# Patient Record
Sex: Female | Born: 1985 | Race: Black or African American | Hispanic: No | Smoking: Never smoker
Health system: Southern US, Community
[De-identification: ages and names within clinical notes are randomized; demographics above are authoritative.]

## PROBLEM LIST (undated history)

## (undated) ENCOUNTER — Inpatient Hospital Stay (HOSPITAL_COMMUNITY): Payer: Self-pay

## (undated) DIAGNOSIS — O24419 Gestational diabetes mellitus in pregnancy, unspecified control: Secondary | ICD-10-CM

## (undated) DIAGNOSIS — O1490 Unspecified pre-eclampsia, unspecified trimester: Secondary | ICD-10-CM

## (undated) DIAGNOSIS — Z8759 Personal history of other complications of pregnancy, childbirth and the puerperium: Secondary | ICD-10-CM

## (undated) DIAGNOSIS — I10 Essential (primary) hypertension: Secondary | ICD-10-CM

## (undated) DIAGNOSIS — K219 Gastro-esophageal reflux disease without esophagitis: Secondary | ICD-10-CM

## (undated) DIAGNOSIS — Z8632 Personal history of gestational diabetes: Secondary | ICD-10-CM

## (undated) DIAGNOSIS — Z87440 Personal history of urinary (tract) infections: Secondary | ICD-10-CM

## (undated) DIAGNOSIS — Z862 Personal history of diseases of the blood and blood-forming organs and certain disorders involving the immune mechanism: Secondary | ICD-10-CM

## (undated) DIAGNOSIS — Z98891 History of uterine scar from previous surgery: Secondary | ICD-10-CM

## (undated) HISTORY — DX: Unspecified pre-eclampsia, unspecified trimester: O14.90

## (undated) HISTORY — DX: Personal history of other complications of pregnancy, childbirth and the puerperium: Z87.59

## (undated) HISTORY — DX: History of uterine scar from previous surgery: Z98.891

## (undated) HISTORY — DX: Personal history of gestational diabetes: Z86.32

## (undated) HISTORY — DX: Personal history of diseases of the blood and blood-forming organs and certain disorders involving the immune mechanism: Z86.2

## (undated) HISTORY — DX: Personal history of urinary (tract) infections: Z87.440

## (undated) HISTORY — DX: Gastro-esophageal reflux disease without esophagitis: K21.9

## (undated) HISTORY — DX: Gestational diabetes mellitus in pregnancy, unspecified control: O24.419

---

## 2010-06-02 ENCOUNTER — Emergency Department: Payer: Self-pay | Admitting: Emergency Medicine

## 2011-11-19 ENCOUNTER — Ambulatory Visit: Payer: Self-pay | Admitting: Family Medicine

## 2012-01-20 ENCOUNTER — Emergency Department: Payer: Self-pay | Admitting: Emergency Medicine

## 2012-01-20 LAB — URINALYSIS, COMPLETE
Bilirubin,UR: NEGATIVE
Glucose,UR: NEGATIVE mg/dL (ref 0–75)
Ketone: NEGATIVE
Ph: 6 (ref 4.5–8.0)
Protein: NEGATIVE
RBC,UR: 1 /HPF (ref 0–5)
Specific Gravity: 1.014 (ref 1.003–1.030)
Squamous Epithelial: 2
WBC UR: 1 /HPF (ref 0–5)

## 2012-01-20 LAB — CBC
HCT: 34.5 % — ABNORMAL LOW (ref 35.0–47.0)
MCH: 28.5 pg (ref 26.0–34.0)
MCHC: 32.6 g/dL (ref 32.0–36.0)
MCV: 87 fL (ref 80–100)
RDW: 14.5 % (ref 11.5–14.5)
WBC: 7.8 10*3/uL (ref 3.6–11.0)

## 2012-02-03 ENCOUNTER — Ambulatory Visit: Payer: Self-pay | Admitting: Family Medicine

## 2012-06-24 ENCOUNTER — Inpatient Hospital Stay: Payer: Self-pay | Admitting: Obstetrics and Gynecology

## 2012-06-24 DIAGNOSIS — O1493 Unspecified pre-eclampsia, third trimester: Secondary | ICD-10-CM

## 2012-06-24 LAB — PIH PROFILE
Anion Gap: 9 (ref 7–16)
BUN: 6 mg/dL — ABNORMAL LOW (ref 7–18)
Chloride: 106 mmol/L (ref 98–107)
Creatinine: 0.44 mg/dL — ABNORMAL LOW (ref 0.60–1.30)
EGFR (African American): >60
EGFR (Non-African Amer.): >60
Glucose: 90 mg/dL (ref 65–99)
HGB: 12.5 g/dL (ref 12.0–16.0)
MCH: 29 pg (ref 26.0–34.0)
Osmolality: 273 (ref 275–301)
Platelet: 237 10*3/uL (ref 150–440)
Potassium: 3.6 mmol/L (ref 3.5–5.1)
RBC: 4.31 10*6/uL (ref 3.80–5.20)
SGOT(AST): 23 U/L (ref 15–37)
Uric Acid: 5.4 mg/dL (ref 2.6–6.0)
WBC: 7.9 10*3/uL (ref 3.6–11.0)

## 2012-06-24 LAB — DIFFERENTIAL
Basophil #: 0.1 10*3/uL (ref 0.0–0.1)
Basophil %: 0.7 %
Eosinophil #: 0.1 10*3/uL (ref 0.0–0.7)
Eosinophil %: 0.8 %
Lymphocyte %: 27.7 %
Monocyte %: 8.4 %
Neutrophil %: 62.4 %

## 2012-06-24 LAB — PROTEIN / CREATININE RATIO, URINE
Creatinine, Urine: 33.4 mg/dL (ref 30.0–125.0)
Protein, Random Urine: 22 mg/dL — ABNORMAL HIGH (ref 0–12)
Protein/Creat. Ratio: 659 mg/gCREAT — ABNORMAL HIGH (ref 0–200)

## 2012-06-25 LAB — HEMOGLOBIN: HGB: 10.6 g/dL — ABNORMAL LOW (ref 12.0–16.0)

## 2012-10-15 ENCOUNTER — Emergency Department: Payer: Self-pay | Admitting: Emergency Medicine

## 2014-08-16 ENCOUNTER — Observation Stay: Payer: Self-pay | Admitting: Obstetrics and Gynecology

## 2014-08-16 LAB — URINALYSIS, COMPLETE
Bilirubin,UR: NEGATIVE
Blood: NEGATIVE
Glucose,UR: NEGATIVE mg/dL (ref 0–75)
Ketone: NEGATIVE
NITRITE: NEGATIVE
PROTEIN: NEGATIVE
Ph: 8 (ref 4.5–8.0)
RBC,UR: 2 /HPF (ref 0–5)
SPECIFIC GRAVITY: 1.01 (ref 1.003–1.030)

## 2014-08-16 LAB — FETAL FIBRONECTIN
Appearance: NORMAL
Fetal Fibronectin: NEGATIVE

## 2014-10-02 ENCOUNTER — Inpatient Hospital Stay: Payer: Self-pay | Admitting: Obstetrics and Gynecology

## 2014-10-02 DIAGNOSIS — O244 Gestational diabetes mellitus: Secondary | ICD-10-CM

## 2014-10-02 LAB — CBC WITH DIFFERENTIAL/PLATELET
Basophil #: 0 10*3/uL (ref 0.0–0.1)
Basophil %: 0.3 %
EOS ABS: 0 10*3/uL (ref 0.0–0.7)
Eosinophil %: 0.5 %
HCT: 38.4 % (ref 35.0–47.0)
HGB: 12.2 g/dL (ref 12.0–16.0)
LYMPHS ABS: 2 10*3/uL (ref 1.0–3.6)
Lymphocyte %: 26.2 %
MCH: 27.2 pg (ref 26.0–34.0)
MCHC: 31.7 g/dL — ABNORMAL LOW (ref 32.0–36.0)
MCV: 86 fL (ref 80–100)
Monocyte #: 0.5 x10 3/mm (ref 0.2–0.9)
Monocyte %: 6.8 %
NEUTROS PCT: 66.2 %
Neutrophil #: 5 10*3/uL (ref 1.4–6.5)
Platelet: 252 10*3/uL (ref 150–440)
RBC: 4.47 10*6/uL (ref 3.80–5.20)
RDW: 14.3 % (ref 11.5–14.5)
WBC: 7.5 10*3/uL (ref 3.6–11.0)

## 2014-10-03 LAB — CBC WITH DIFFERENTIAL/PLATELET
BASOS PCT: 0.6 %
Basophil #: 0.1 10*3/uL (ref 0.0–0.1)
Eosinophil #: 0 10*3/uL (ref 0.0–0.7)
Eosinophil %: 0.3 %
HCT: 32.7 % — ABNORMAL LOW (ref 35.0–47.0)
HGB: 10.5 g/dL — ABNORMAL LOW (ref 12.0–16.0)
Lymphocyte #: 1.4 10*3/uL (ref 1.0–3.6)
Lymphocyte %: 9.9 %
MCH: 27.5 pg (ref 26.0–34.0)
MCHC: 32.2 g/dL (ref 32.0–36.0)
MCV: 85 fL (ref 80–100)
MONOS PCT: 6.2 %
Monocyte #: 0.9 x10 3/mm (ref 0.2–0.9)
NEUTROS PCT: 83 %
Neutrophil #: 11.4 10*3/uL — ABNORMAL HIGH (ref 1.4–6.5)
PLATELETS: 195 10*3/uL (ref 150–440)
RBC: 3.82 10*6/uL (ref 3.80–5.20)
RDW: 14.9 % — ABNORMAL HIGH (ref 11.5–14.5)
WBC: 13.7 10*3/uL — ABNORMAL HIGH (ref 3.6–11.0)

## 2014-10-03 LAB — GLUCOSE, RANDOM: GLUCOSE: 95 mg/dL (ref 65–99)

## 2014-10-27 ENCOUNTER — Emergency Department: Payer: Self-pay | Admitting: Emergency Medicine

## 2015-02-16 NOTE — Op Note (Signed)
PATIENT NAME:  Jordan Mathis, Jordan Mathis MR#:  [id number] DATE OF BIRTH:  1986-05-22  DATE OF PROCEDURE:  10/02/2014  PREOPERATIVE DIAGNOSES:  1.  Intrauterine pregnancy at 39 weeks and 4 days.  2.  Gestational diabetes type A1.  3.  Ultrasound indicated for EFW of 4500gm 4.  Maternal desire for primary cesarean section.   POST OPERATIVE DIAGNOSES: 1.  Intrauterine pregnancy at 39 weeks and 4 days.  2.  Gestational diabetes type A1.  3.  Ultrasound indicated for EFW of 4500gm 4.  Maternal desire for primary cesarean section.   PROCEDURE: A primary low transverse cesarean section via Pfannenstiel skin incision with double layer uterine closure.   SURGEON: [place] Bingharlie Chayna Surratt, MD   ASSISTANT: Marta Lamasamara K. Brothers, CNM    INTRAVENOUS FLUIDS: One liter crystalloid.   ESTIMATED BLOOD LOSS: 700 mL.   URINE OUTPUT: 55 mL.   VENOUS THROMBOEMBOLISM PROPHYLAXIS: SCDs applied to bilateral lower extremities.   ANTIBIOTICS: Ancef 2 grams given preoperatively.   COMPLICATIONS: None.   FINDINGS: Grossly normal uterus, tubes, and ovaries bilaterally. Female infant in cephalic presentation, clear amniotic fluid. Birth weight of 3700 grams. Apgars of 9 and 9. Grossly normal placenta.   DESCRIPTION OF PROCEDURE: The patient was taken to the operating room where anesthesia was administered. She was then prepped and draped in normal sterile fashion in the dorsal supine position with a leftward tilt. A Pfannenstiel skin incision was made with the scalpel and carried down to the underlying fascia with the Bovie. The fascia was then nicked in the midline, and using Kochers superiorly and inferiorly the rectus muscles were dissected off the fascia. Next, the rectus muscles were then separated in the midline bluntly and the bladder blade was inserted. Next, a bladder flap was created with the Metzenbaum scissors and the bladder blade was then reinserted. Next, the hysterotomy was made with a scalpel and the amniotic sac  was then punctured with the Allis clamp. The infant was then delivered atraumatically without issue and cord clamped x 2 and cut and handed to the waiting pediatricians. Next, the placenta was then gradually expressed and the uterus was then exteriorized, cleared of all clots and debris. Next, the hysterotomy was then closed with a running locking suture of 1-0 Monocryl and a second imbricating layer was then placed for excellent hemostasis. The uterus was then returned to the abdomen. The peritoneum was then closed with 3-0 Vicryl in a running fashion and the fascia was then closed bilaterally with 1-0 Vicryl. The subcutaneous layer was then closed with interrupted sutures of 2-0 plain gut, and the skin was then closed with 4-0 Monocryl in a subcuticular fashion. Sponge, lap, needle, and instrument counts were correct x 2. The patient was taken to the recovery room awake, alert, breathing independently in a stable condition.    ____________________________ [place] Bingharlie Arayla Kruschke, MD cp:bm Mathis: 10/02/2014 20:41:00 ET T: 10/03/2014 05:42:59 ET JOB#: [id number]  cc: [place] Bingharlie Emilie Carp, MD, <Dictator> [place] BingHARLIE Houda Brau MD ELECTRONICALLY SIGNED 10/07/2014 14:26

## 2015-02-25 NOTE — H&P (Signed)
L&D Evaluation:  History:  HPI 29 year old G2P1001 at 32 weeks 6 days [facility] presenting with contractions q6230minutes since 0800 today.  States she is feeling sharp pain in lower abdomen groin, exacerbated by movement.  No dysuria.  No other GI symptoms.  She has no prior history of preterm labor.  +FM, no LOF, no VB.  She denies intercourse in the last 24-hrs.  The patients prior pregnancy was complicated by preeclampsia.  Her current pregnancy is noteable for recently diagnosed GDM.  Her 1-hr 172 with follow up 3-hr 109, 204, 180, 88 (3/4 abnormal) on 08/02/14.  She has not yet started checking her BG.   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  HEENT, CNS, GI, GU, Respiratory, CV, Renal and Musculoskeletal systems were found to be normal.   Exam:  Vital Signs stable  121/66   Urine Protein not completed, UA pending   General no apparent distress   Mental Status clear   Chest no increased work of breathing   Abdomen gravid, non-tender   Estimated Fetal Weight Average for gestational age   Back no CVAT   Edema no edema   Pelvic no external lesions, FTP/Long/high   Mebranes Intact   FHT normal rate with no decels, 150, moderate, +accles, no decels   Ucx some mild irritability   Skin dry, no lesions   Impression:  Impression 29 yo G2P1001 at 702w6d r/o PTL   Plan:  Plan EFM/NST, monitor contractions and for cervical change   Comments 1) R/O PTL - cervix only fingertip, no contractions on tocometer maybe some mild uterine irritability - UA - FFN - Recheck 1-2hrs  2) Fetus - categoy I tracing  3) B pos / ABSC neg / RI / VZI / RPR NR / HBsAg neg / HIV neg  4) TDAP and influenza vaccination received 07/31/2014  5) GDM - provided Rx for  glucometer, testing strips, and lancets  6) Disposition - pending rechcek   Follow Up Appointment already scheduled. 08/21/2014 [id number]   Electronic Signatures for Addendum Section:  Lorrene ReidStaebler, Hulet Ehrmann M (MD) (Signed Addendum 30-Oct-15 22:14)  Cervix stable on recheck, no contractions on toco, FFN negative.  UA possible UTI Rx macrobid 100mg  po bid x 7 days.  Routine precautions.   Electronic Signatures: Lorrene ReidStaebler, Devorah Givhan M (MD)  (Signed 30-Oct-15 21:02)  Authored: L&D Evaluation   Last Updated: 30-Oct-15 22:14 by Lorrene ReidStaebler, Keaton Beichner M (MD)

## 2015-04-02 ENCOUNTER — Emergency Department
Admission: EM | Admit: 2015-04-02 | Discharge: 2015-04-02 | Disposition: A | Discharge status: Home / Self Care | Payer: Commercial Managed Care - PPO | Attending: Student | Admitting: Student

## 2015-04-02 DIAGNOSIS — L02411 Cutaneous abscess of right axilla: Secondary | ICD-10-CM

## 2015-04-02 DIAGNOSIS — Z9889 Other specified postprocedural states: Secondary | ICD-10-CM

## 2015-04-02 MED ORDER — LIDOCAINE-EPINEPHRINE (PF) 1 %-1:200000 IJ SOLN
INTRAMUSCULAR | Status: AC
  Filled 2015-04-02: qty 30

## 2015-04-02 MED ORDER — KETOROLAC TROMETHAMINE 10 MG PO TABS: 10.0000 mg | ORAL_TABLET | Freq: Three times a day (TID) | ORAL | Status: DC

## 2015-04-02 MED ORDER — SULFAMETHOXAZOLE-TRIMETHOPRIM 800-160 MG PO TABS: 2.0000 | ORAL_TABLET | Freq: Two times a day (BID) | ORAL | Status: DC

## 2015-04-02 NOTE — ED Notes (Signed)
Patient arrives to [facility] ED with complaint of right axillary abscess. Denies n/v fever. Denies drainage

## 2015-04-02 NOTE — ED Provider Notes (Signed)
[facility] Emergency Department Provider Note ____________________________________________  Time seen: 0828  I have reviewed the triage vital signs and the nursing notes.  HISTORY  Chief Complaint Abscess  HPI Jordan Mathis is a 29 y.o. female who reports to the ED with 2 weeks complaint of tender, swollen area to the right armpit. She has applied warm compresses and dosed Tylenol for symptom relief. She denies fever, chills, sweats, or spontaneous drainage.   No past medical history on file.  There are no active problems to display for this patient.  No past surgical history on file.  Current Outpatient Rx  Name  Route  Sig  Dispense  Refill  . ketorolac (TORADOL) 10 MG tablet   Oral   Take 1 tablet (10 mg total) by mouth every 8 (eight) hours.   15 tablet   0   . sulfamethoxazole-trimethoprim (BACTRIM DS,SEPTRA DS) 800-160 MG per tablet   Oral   Take 2 tablets by mouth 2 (two) times daily.   40 tablet   0    Allergies Review of patient's allergies indicates not on file.  No family history on file.  Social History History  Substance Use Topics  . Smoking status: Not on file  . Smokeless tobacco: Not on file  . Alcohol Use: Not on file   Review of Systems  Constitutional: Negative for fever. Eyes: Negative for visual changes. ENT: Negative for sore throat. Cardiovascular: Negative for chest pain. Respiratory: Negative for shortness of breath. Gastrointestinal: Negative for abdominal pain, vomiting and diarrhea. Genitourinary: Negative for dysuria. Musculoskeletal: Negative for back pain. Skin: Negative for rash. Positive for abscess formation as above. Neurological: Negative for headaches, focal weakness or numbness. ____________________________________________  PHYSICAL EXAM:  VITAL SIGNS: ED Triage Vitals  Enc Vitals Group     BP 04/02/15 0722 132/91 mmHg     Pulse Rate 04/02/15 0722 99     Resp 04/02/15 0722 18      Temp 04/02/15 0722 97.8 F (36.6 C)     Temp Source 04/02/15 0722 Oral     SpO2 04/02/15 0722 99 %     Weight 04/02/15 0722 143 lb (64.864 kg)     Height 04/02/15 0722 5\' 3"  (1.6 m)     Head Cir --      Peak Flow --      Pain Score 04/02/15 0741 8     Pain Loc --      Pain Edu? --      Excl. in GC? --    Constitutional: Alert and oriented. Well appearing and in no distress. Eyes: Conjunctivae are normal. PERRL. Normal extraocular movements. ENT   Head: Normocephalic and atraumatic.   Nose: No congestion/rhinnorhea.   Mouth/Throat: Mucous membranes are moist.   Neck: No stridor. Hematological/Lymphatic/Immunilogical: No cervical lymphadenopathy. Cardiovascular: Normal rate, regular rhythm.  Respiratory: Normal respiratory effort. Musculoskeletal: Nontender with normal range of motion in all extremities. Neurologic:  Normal speech and language. No gross focal neurologic deficits are appreciated. Skin:  Skin is warm, dry and intact. No rash noted. Tender, firm, erythematous, 4 cm lesion with central fluctuance, to the right axilla. Psychiatric: Mood and affect are normal. Patient exhibits appropriate insight and judgment. ____________________________________________  INCISION AND DRAINAGE Performed by: Lissa Hoard Consent: Verbal consent obtained. Risks and benefits: risks, benefits and alternatives were discussed Type: abscess  Body area: Right axilla  Anesthesia: local infiltration  Incision was made with a #11 scalpel.  Local anesthetic: lidocaine 1%  w/ epinephrine  Anesthetic total: 5 ml  Complexity: complex Blunt dissection to break up loculations  Drainage: purulent  Drainage amount:  5 ml  Packing material: 1/4 in iodoform gauze  Patient tolerance: Patient tolerated the procedure well with no immediate complications. ____________________________________________  INITIAL IMPRESSION / ASSESSMENT AND PLAN / ED COURSE  Right  axilla abscess and cellulitis s/p I&D. ____________________________________________  FINAL CLINICAL IMPRESSION(S) / ED DIAGNOSES  Final diagnoses:  Abscess of axilla, right  Status post incision and drainage     Lissa Hoard, PA-C 04/02/15 [id number]  Gayla Doss, MD 04/08/15 [phone]

## 2015-04-02 NOTE — Discharge Instructions (Signed)
Abscess Care After An abscess (also called a boil or furuncle) is an infected area that contains a collection of pus. Signs and symptoms of an abscess include pain, tenderness, redness, or hardness, or you may feel a moveable soft area under your skin. An abscess can occur anywhere in the body. The infection may spread to surrounding tissues causing cellulitis. A cut (incision) by the surgeon was made over your abscess and the pus was drained out. Gauze may have been packed into the space to provide a drain that will allow the cavity to heal from the inside outwards. The boil may be painful for 5 to 7 days. Most people with a boil do not have high fevers. Your abscess, if seen early, may not have localized, and may not have been lanced. If not, another appointment may be required for this if it does not get better on its own or with medications. HOME CARE INSTRUCTIONS   Only take over-the-counter or prescription medicines for pain, discomfort, or fever as directed by your caregiver.  When you bathe, soak and then remove gauze or iodoform packs at least daily or as directed by your caregiver. You may then wash the wound gently with mild soapy water. Repack with gauze or do as your caregiver directs. SEEK IMMEDIATE MEDICAL CARE IF:   You develop increased pain, swelling, redness, drainage, or bleeding in the wound site.  You develop signs of generalized infection including muscle aches, chills, fever, or a general ill feeling.  An oral temperature above 102 F (38.9 C) develops, not controlled by medication. See your caregiver for a recheck if you develop any of the symptoms described above. If medications (antibiotics) were prescribed, take them as directed. Document Released: 04/22/2005 Document Revised: 12/27/2011 Document Reviewed: 12/18/2007 [facility] Patient Information 2015 [place], [place]. This information is not intended to replace advice given to you by your health care provider. Make sure  you discuss any questions you have with your health care provider.  Take the prescription meds as directed.  Apply warm compresses to promote healing. Return to the ED in 2 days for wound recheck.

## 2015-04-04 ENCOUNTER — Emergency Department
Admission: EM | Admit: 2015-04-04 | Discharge: 2015-04-04 | Discharge status: Against Medical Advice | Payer: Commercial Managed Care - PPO | Attending: Emergency Medicine | Admitting: Emergency Medicine

## 2015-04-04 DIAGNOSIS — Z4801 Encounter for change or removal of surgical wound dressing: Secondary | ICD-10-CM | POA: Diagnosis present

## 2015-04-04 NOTE — ED Notes (Signed)
Patient to ED for wound re-check to drained abscess of RUE.

## 2016-07-08 ENCOUNTER — Encounter: Payer: Self-pay | Admitting: Emergency Medicine

## 2016-07-08 ENCOUNTER — Emergency Department
Admission: EM | Admit: 2016-07-08 | Discharge: 2016-07-08 | Disposition: A | Discharge status: Home / Self Care | Payer: Commercial Managed Care - PPO | Attending: Student in an Organized Health Care Education/Training Program | Admitting: Student in an Organized Health Care Education/Training Program

## 2016-07-08 DIAGNOSIS — G44219 Episodic tension-type headache, not intractable: Secondary | ICD-10-CM

## 2016-07-08 MED ORDER — BUTALBITAL-APAP-CAFFEINE 50-325-40 MG PO TABS: 1.0000 | ORAL_TABLET | Freq: Four times a day (QID) | ORAL | 0 refills | Status: DC | PRN

## 2016-07-08 MED ORDER — METHOCARBAMOL 500 MG PO TABS: 500.0000 mg | ORAL_TABLET | Freq: Four times a day (QID) | ORAL | 0 refills | Status: DC | PRN

## 2016-07-08 NOTE — ED Triage Notes (Signed)
Pt states she has had a headache x5 days, for which her PCP prescribed Motrin 800mg , which has not helped the pain.  Pt states when she stands up from sitting, she feels "swimmy-headed" and "off".  Pt reports congestion in ears and head.  Denies fever at home.  Pt took Ibuprofen this morning at 0600.

## 2016-07-08 NOTE — ED Provider Notes (Signed)
[facility] Emergency Department Provider Note  ____________________________________________  Time seen: Approximately 9:48 AM  I have reviewed the triage vital signs and the nursing notes.   HISTORY  Chief Complaint Headache    HPI Jordan Mathis is a 30 y.o. female presents with a 5 day history of a headache. He saw her PCP was given Motrin 800 with no relief. Patient states that she occasionally feels a little bit dizzy when she stands up and noticed her blood pressures upper lobe it. Patient denies it being the worst headache of her life. Refuses to have a head CT at this time. States that she is under stress from recently moving and transferring children back and forth to [facility].   History reviewed. No pertinent past medical history.  There are no active problems to display for this patient.   Past Surgical History:  Procedure Laterality Date  . CESAREAN SECTION      Prior to Admission medications   Medication Sig Start Date End Date Taking? Authorizing Provider  butalbital-acetaminophen-caffeine (FIORICET, ESGIC) [phone] MG tablet Take 1-2 tablets by mouth every 6 (six) hours as needed for headache. 07/08/16   Evangeline Dakin, PA-C  methocarbamol (ROBAXIN) 500 MG tablet Take 1 tablet (500 mg total) by mouth every 6 (six) hours as needed for muscle spasms. 07/08/16   Evangeline Dakin, PA-C    Allergies Review of patient's allergies indicates no known allergies.  No family history on file.  Social History Social History  Substance Use Topics  . Smoking status: Never Smoker  . Smokeless tobacco: Never Used  . Alcohol use Yes     Comment: occasionally    Review of Systems Constitutional: No fever/chills Eyes: No visual changes. ENT: No sore throat. Cardiovascular: Denies chest pain. Respiratory: Denies shortness of breath. Gastrointestinal: No abdominal pain.  No nausea, no vomiting.  No diarrhea.  No  constipation. Genitourinary: Negative for dysuria. Musculoskeletal: Negative for back pain. Skin: Negative for rash. Neurological: Positive for headaches, negative for focal weakness or numbness.  10-point ROS otherwise negative.  ____________________________________________   PHYSICAL EXAM:  VITAL SIGNS: ED Triage Vitals  Enc Vitals Group     BP 07/08/16 0859 (!) 149/101     Pulse Rate 07/08/16 0859 81     Resp 07/08/16 0859 (!) 2     Temp 07/08/16 0859 99 F (37.2 C)     Temp Source 07/08/16 0859 Oral     SpO2 07/08/16 0859 100 %     Weight 07/08/16 0900 165 lb (74.8 kg)     Height 07/08/16 0900 5\' 3"  (1.6 m)     Head Circumference --      Peak Flow --      Pain Score 07/08/16 0901 7     Pain Loc --      Pain Edu? --      Excl. in GC? --     Constitutional: Alert and oriented. Well appearing and in no acute distress. Eyes: Conjunctivae are normal. PERRL. EOMI.Fundi unremarkable Head: Atraumatic. Nose: No congestion/rhinnorhea. Mouth/Throat: Mucous membranes are moist.  Oropharynx non-erythematous. Neck: No stridor.  Supple, full range of motion. Slight tenderness noted at the base of the neck. Cardiovascular: Normal rate, regular rhythm. Grossly normal heart sounds.  Good peripheral circulation. Respiratory: Normal respiratory effort.  No retractions. Lungs CTAB. Musculoskeletal: No lower extremity tenderness nor edema.  No joint effusions. Neurologic:  Normal speech and language. No gross focal neurologic deficits are appreciated. No gait  instability. Skin:  Skin is warm, dry and intact. No rash noted. Psychiatric: Mood and affect are normal. Speech and behavior are normal.  ____________________________________________   LABS (all labs ordered are listed, but only abnormal results are displayed)  Labs Reviewed - No data to display ____________________________________________  EKG   ____________________________________________  RADIOLOGY  Head CT  deferred at this visit. ____________________________________________   PROCEDURES  Procedure(s) performed: None  Critical Care performed: No  ____________________________________________   INITIAL IMPRESSION / ASSESSMENT AND PLAN / ED COURSE  Pertinent labs & imaging results that were available during my care of the patient were reviewed by me and considered in my medical decision making (see chart for details). Review of the [place] CSRS was performed in accordance of the [facility] prior to dispensing any controlled drugs.  Acute headache. More than likely muscle contraction/tension headache. Rx given for Fioricet and Robaxin. She is to follow-up with her PCP or return to ER as needed. Encouraged her to follow up and monitor her blood pressure as well.  Clinical Course    ____________________________________________   FINAL CLINICAL IMPRESSION(S) / ED DIAGNOSES  Final diagnoses:  Episodic tension-type headache, not intractable     This chart was dictated using voice recognition software/Dragon. Despite best efforts to proofread, errors can occur which can change the meaning. Any change was purely unintentional.   Evangeline Dakinharles M Ericha Whittingham, PA-C 07/08/16 1013    Willy EddyPatrick Robinson, MD 07/08/16 1056

## 2016-07-08 NOTE — Discharge Instructions (Signed)
Follow-up with your local health care provider to monitor your blood pressure.

## 2016-08-21 ENCOUNTER — Emergency Department (HOSPITAL_COMMUNITY)
Admission: EM | Admit: 2016-08-21 | Discharge: 2016-08-21 | Disposition: A | Discharge status: Home / Self Care | Payer: Commercial Managed Care - PPO | Attending: Emergency Medicine | Admitting: Emergency Medicine

## 2016-08-21 ENCOUNTER — Encounter (HOSPITAL_COMMUNITY): Payer: Self-pay

## 2016-08-21 DIAGNOSIS — J029 Acute pharyngitis, unspecified: Secondary | ICD-10-CM

## 2016-08-21 LAB — RAPID STREP SCREEN (MED CTR MEBANE ONLY): STREPTOCOCCUS, GROUP A SCREEN (DIRECT): NEGATIVE

## 2016-08-21 MED ORDER — CETIRIZINE HCL 10 MG PO TABS: 10.0000 mg | ORAL_TABLET | Freq: Every day | ORAL | 0 refills | Status: DC

## 2016-08-21 MED ORDER — IBUPROFEN 800 MG PO TABS: 800.0000 mg | ORAL_TABLET | Freq: Three times a day (TID) | ORAL | 0 refills | Status: DC

## 2016-08-21 MED ORDER — ACETAMINOPHEN 500 MG PO TABS: 1000.0000 mg | ORAL_TABLET | Freq: Four times a day (QID) | ORAL | 0 refills | Status: DC | PRN

## 2016-08-21 NOTE — ED Notes (Signed)
Declined W/C at D/C and was escorted to lobby by RN. 

## 2016-08-21 NOTE — ED Provider Notes (Signed)
[facility]-EMERG[facility]Y DEPT Provider Note   By signing my name below, I, Earmon PhoenixJennifer Waddell, attest that this documentation has been prepared under the direction and in the presence of Cheri FowlerKayla Jailyn Langhorst, PA-C. Electronically Signed: Earmon PhoenixJennifer Waddell, ED Scribe. 08/21/16. 9:52 AM.   History   Chief Complaint Chief Complaint  Patient presents with  . Sore Throat    HPI  HPI Comments:  Stellarose Dominque Laural BenesJohnson is a 30 y.o. female who presents to the Emergency Department complaining of sore throat that began yesterday. She reports associated sneezing, chills, generalized body aches and left ear pain. She has taken Tylenol with no significant relief of the pain. She denies modifying factors. She denies fever, cough, rhinorrhea, nasal congestion, difficulty breathing or swallowing.    History reviewed. No pertinent past medical history.  There are no active problems to display for this patient.   Past Surgical History:  Procedure Laterality Date  . CESAREAN SECTION      OB History    No data available       Home Medications    Prior to Admission medications   Medication Sig Start Date End Date Taking? Authorizing Provider  acetaminophen (TYLENOL) 500 MG tablet Take 2 tablets (1,000 mg total) by mouth every 6 (six) hours as needed. 08/21/16   Cheri FowlerKayla Adie Vilar, PA-C  butalbital-acetaminophen-caffeine (FIORICET, ESGIC) [phone] MG tablet Take 1-2 tablets by mouth every 6 (six) hours as needed for headache. 07/08/16   Evangeline Dakinharles M Beers, PA-C  cetirizine (ZYRTEC) 10 MG tablet Take 1 tablet (10 mg total) by mouth daily. 08/21/16   Cheri FowlerKayla Yaira Bernardi, PA-C  ibuprofen (ADVIL,MOTRIN) 800 MG tablet Take 1 tablet (800 mg total) by mouth 3 (three) times daily. 08/21/16   Cheri FowlerKayla Wannetta Langland, PA-C  methocarbamol (ROBAXIN) 500 MG tablet Take 1 tablet (500 mg total) by mouth every 6 (six) hours as needed for muscle spasms. 07/08/16   Evangeline Dakinharles M Beers, PA-C    Family History No family history on file.  Social History Social History    Substance Use Topics  . Smoking status: Never Smoker  . Smokeless tobacco: Never Used  . Alcohol use Yes     Comment: occasionally     Allergies   Review of patient's allergies indicates no known allergies.   Review of Systems Review of Systems  Constitutional: Negative for chills and fever.  HENT: Positive for ear pain, sneezing and sore throat. Negative for congestion, rhinorrhea and trouble swallowing.   Respiratory: Negative for shortness of breath.   Musculoskeletal: Positive for myalgias.  All other systems reviewed and are negative.    Physical Exam Updated Vital Signs BP 134/98 (BP Location: Left Arm)   Pulse 100   Temp 98.7 F (37.1 C) (Oral)   Resp 18   Ht 5\' 3"  (1.6 m)   Wt 165 lb (74.8 kg)   SpO2 100%   BMI 29.23 kg/m   Physical Exam  Constitutional: She is oriented to person, place, and time. She appears well-developed and well-nourished. She is active.  Non-toxic appearance. She does not have a sickly appearance. She does not appear ill.  HENT:  Head: Normocephalic and atraumatic.  Right Ear: Tympanic membrane and external ear normal. Tympanic membrane is not erythematous and not bulging.  Left Ear: Tympanic membrane and external ear normal. Tympanic membrane is not erythematous and not bulging.  Nose: Nose normal.  Mouth/Throat: Uvula is midline and mucous membranes are normal. No trismus in the jaw. No uvula swelling. Posterior oropharyngeal edema present. No oropharyngeal exudate, posterior  oropharyngeal erythema or tonsillar abscesses. Tonsils are 3+ on the right. Tonsils are 3+ on the left.  Neck: Normal range of motion. Neck supple.  No nuchal rigidity.   Cardiovascular: Normal rate and regular rhythm.   Pulmonary/Chest: Effort normal and breath sounds normal. No respiratory distress. She has no wheezes. She has no rales.  Abdominal: Soft. Bowel sounds are normal. She exhibits no distension. There is no tenderness.  Musculoskeletal: Normal range  of motion.  Lymphadenopathy:    She has no cervical adenopathy.  Neurological: She is alert and oriented to person, place, and time.  Skin: Skin is warm and dry.  Psychiatric: She has a normal mood and affect. Her behavior is normal.     ED Treatments / Results  DIAGNOSTIC STUDIES: Oxygen Saturation is 100% on RA, normal by my interpretation.   COORDINATION OF CARE: 9:42 AM- Will wait for rapid strep test results. Pt verbalizes understanding and agrees to plan.  Medications - No data to display  Labs (all labs ordered are listed, but only abnormal results are displayed) Labs Reviewed  RAPID STREP SCREEN (NOT AT [facility])  CULTURE, GROUP A STREP [facility]    EKG  EKG Interpretation None       Radiology No results found.  Procedures Procedures (including critical care time)  Medications Ordered in ED Medications - No data to display   Initial Impression / Assessment and Plan / ED Course  I have reviewed the triage vital signs and the nursing notes.  Pertinent labs & imaging results that were available during my care of the patient were reviewed by me and considered in my medical decision making (see chart for details).  Clinical Course   Findings consistent with viral pharyngitis.  Rapid strep negative.  VSS, NAD.  No evidence of peritonsillar abscess, meningitis, or epiglottitis.  Patient appears well, non-toxic or ill.  Will treat symptomatically with Ibuprofen and Tylenol. Return precautions discussed.  Stable for discharge.    Final Clinical Impressions(s) / ED Diagnoses   Final diagnoses:  Viral pharyngitis    New Prescriptions New Prescriptions   ACETAMINOPHEN (TYLENOL) 500 MG TABLET    Take 2 tablets (1,000 mg total) by mouth every 6 (six) hours as needed.   CETIRIZINE (ZYRTEC) 10 MG TABLET    Take 1 tablet (10 mg total) by mouth daily.   IBUPROFEN (ADVIL,MOTRIN) 800 MG TABLET    Take 1 tablet (800 mg total) by mouth 3 (three) times daily.   I personally  performed the services described in this documentation, which was scribed in my presence. The recorded information has been reviewed and is accurate.    Cheri FowlerKayla Zyiah Withington, PA-C 08/21/16 [id number]    Loren Raceravid Yelverton, MD 08/21/16 [phone]

## 2016-08-21 NOTE — ED Triage Notes (Addendum)
Patient complains of sore throat and congestion x 3 days, NAD. Stats pain now running up to left ear.. Chills with same

## 2016-08-23 LAB — CULTURE, GROUP A STREP (THRC)

## 2016-11-17 ENCOUNTER — Encounter: Payer: Self-pay | Admitting: Emergency Medicine

## 2016-11-17 ENCOUNTER — Emergency Department (HOSPITAL_COMMUNITY)
Admission: EM | Admit: 2016-11-17 | Discharge: 2016-11-17 | Disposition: A | Discharge status: Home / Self Care | Payer: Commercial Managed Care - PPO | Attending: Physician Assistant | Admitting: Physician Assistant

## 2016-11-17 DIAGNOSIS — R197 Diarrhea, unspecified: Secondary | ICD-10-CM | POA: Insufficient documentation

## 2016-11-17 DIAGNOSIS — R112 Nausea with vomiting, unspecified: Secondary | ICD-10-CM

## 2016-11-17 DIAGNOSIS — Z79899 Other long term (current) drug therapy: Secondary | ICD-10-CM | POA: Insufficient documentation

## 2016-11-17 LAB — URINALYSIS, ROUTINE W REFLEX MICROSCOPIC
Bilirubin Urine: NEGATIVE
GLUCOSE, UA: NEGATIVE mg/dL
Ketones, ur: NEGATIVE mg/dL
Nitrite: NEGATIVE
Protein, ur: NEGATIVE mg/dL
Specific Gravity, Urine: 1.014 (ref 1.005–1.030)
pH: 7 (ref 5.0–8.0)

## 2016-11-17 LAB — COMPREHENSIVE METABOLIC PANEL
ALK PHOS: 67 U/L (ref 38–126)
ALT: 12 U/L — AB (ref 14–54)
AST: 14 U/L — AB (ref 15–41)
Albumin: 3.5 g/dL (ref 3.5–5.0)
Anion gap: 6 (ref 5–15)
BILIRUBIN TOTAL: 0.6 mg/dL (ref 0.3–1.2)
BUN: 5 mg/dL — AB (ref 6–20)
CALCIUM: 9.1 mg/dL (ref 8.9–10.3)
CHLORIDE: 107 mmol/L (ref 101–111)
CO2: 24 mmol/L (ref 22–32)
Creatinine, Ser: 0.54 mg/dL (ref 0.44–1.00)
GFR calc Af Amer: >60 mL/min (ref >60)
Glucose, Bld: 98 mg/dL (ref 65–99)
Potassium: 3.3 mmol/L — ABNORMAL LOW (ref 3.5–5.1)
Sodium: 137 mmol/L (ref 135–145)
Total Protein: 7.5 g/dL (ref 6.5–8.1)

## 2016-11-17 LAB — CBC
HCT: 38.3 % (ref 36.0–46.0)
Hemoglobin: 12.5 g/dL (ref 12.0–15.0)
MCH: 27.7 pg (ref 26.0–34.0)
MCHC: 32.6 g/dL (ref 30.0–36.0)
MCV: 84.9 fL (ref 78.0–100.0)
PLATELETS: 386 10*3/uL (ref 150–400)
RBC: 4.51 MIL/uL (ref 3.87–5.11)
RDW: 13.7 % (ref 11.5–15.5)
WBC: 9.4 10*3/uL (ref 4.0–10.5)

## 2016-11-17 LAB — LIPASE, BLOOD: LIPASE: 26 U/L (ref 11–51)

## 2016-11-17 LAB — POC URINE PREG, ED: Preg Test, Ur: NEGATIVE

## 2016-11-17 MED ORDER — ONDANSETRON 4 MG PO TBDP
ORAL_TABLET | ORAL | Status: AC
  Filled 2016-11-17: qty 1

## 2016-11-17 MED ORDER — ONDANSETRON 4 MG PO TBDP
4.0000 mg | ORAL_TABLET | Freq: Once | ORAL | Status: AC | PRN
  Administered 2016-11-17: 4 mg via ORAL

## 2016-11-17 MED ORDER — ONDANSETRON HCL 4 MG PO TABS: 4.0000 mg | ORAL_TABLET | Freq: Three times a day (TID) | ORAL | 0 refills | Status: DC | PRN

## 2016-11-17 NOTE — ED Notes (Signed)
Patient Alert and oriented X4. Stable and ambulatory. Patient verbalized understanding of the discharge instructions.  Patient belongings were taken by the patient.  

## 2016-11-17 NOTE — ED Provider Notes (Signed)
[facility] Provider Note   CSN: [id number] Arrival date & time: 11/17/16  1623     History   Chief Complaint Chief Complaint  Patient presents with  . Abdominal Pain  . Emesis    HPI Jordan Mathis is a 31 y.o. female.  HPI   She is a 31 year old female presenting with vomiting and diarrhea. This started after she ate pizza last night. Patient has several episodes of vomiting and diarrhea. Patientasaw little bit of bright red blood in her vomit after several episodes. Patient's had no syncope, abdominal pain, or other concerns.  The blood was streaked in her vomit. No black emesis.    No past medical history on file.  There are no active problems to display for this patient.   Past Surgical History:  Procedure Laterality Date  . CESAREAN SECTION      OB History    No data available       Home Medications    Prior to Admission medications   Medication Sig Start Date End Date Taking? Authorizing Provider  acetaminophen (TYLENOL) 500 MG tablet Take 2 tablets (1,000 mg total) by mouth every 6 (six) hours as needed. 08/21/16   Cheri FowlerKayla Rose, PA-C  butalbital-acetaminophen-caffeine (FIORICET, ESGIC) [phone] MG tablet Take 1-2 tablets by mouth every 6 (six) hours as needed for headache. 07/08/16   Evangeline Dakinharles M Beers, PA-C  cetirizine (ZYRTEC) 10 MG tablet Take 1 tablet (10 mg total) by mouth daily. 08/21/16   Cheri FowlerKayla Rose, PA-C  ibuprofen (ADVIL,MOTRIN) 800 MG tablet Take 1 tablet (800 mg total) by mouth 3 (three) times daily. 08/21/16   Cheri FowlerKayla Rose, PA-C  methocarbamol (ROBAXIN) 500 MG tablet Take 1 tablet (500 mg total) by mouth every 6 (six) hours as needed for muscle spasms. 07/08/16   Evangeline Dakinharles M Beers, PA-C    Family History No family history on file.  Social History Social History  Substance Use Topics  . Smoking status: Never Smoker  . Smokeless tobacco: Never Used  . Alcohol use Yes     Comment: occasionally     Allergies   Patient has no  known allergies.   Review of Systems Review of Systems  Respiratory: Negative for chest tightness.   Gastrointestinal: Positive for vomiting. Negative for abdominal pain.  Musculoskeletal: Negative for back pain.  Neurological: Negative for dizziness.  All other systems reviewed and are negative.    Physical Exam Updated Vital Signs BP 114/55   Pulse 73   Temp 98.8 F (37.1 C) (Oral)   Resp 16   LMP 11/15/2016 (Exact Date)   SpO2 100%   Physical Exam  Constitutional: She is oriented to person, place, and time. She appears well-developed and well-nourished.  HENT:  Head: Normocephalic and atraumatic.  Eyes: Right eye exhibits no discharge. Left eye exhibits no discharge.  Cardiovascular: Normal rate.   Pulmonary/Chest: Effort normal.  Neurological: She is oriented to person, place, and time.  Skin: Skin is warm and dry. She is not diaphoretic.  Psychiatric: She has a normal mood and affect.  Nursing note and vitals reviewed.    ED Treatments / Results  Labs (all labs ordered are listed, but only abnormal results are displayed) Labs Reviewed  COMPREHENSIVE METABOLIC PANEL - Abnormal; Notable for the following:       Result Value   Potassium 3.3 (*)    BUN 5 (*)    AST 14 (*)    ALT 12 (*)    All other components within normal  limits  URINALYSIS, ROUTINE W REFLEX MICROSCOPIC - Abnormal; Notable for the following:    APPearance HAZY (*)    Hgb urine dipstick MODERATE (*)    Leukocytes, UA TRACE (*)    Bacteria, UA RARE (*)    Squamous Epithelial / LPF 6-30 (*)    All other components within normal limits  LIPASE, BLOOD  CBC  POC URINE PREG, ED    EKG  EKG Interpretation None       Radiology No results found.  Procedures Procedures (including critical care time)  Medications Ordered in ED Medications  ondansetron (ZOFRAN-ODT) disintegrating tablet 4 mg (4 mg Oral Given 11/17/16 1706)     Initial Impression / Assessment and Plan / ED Course  I  have reviewed the triage vital signs and the nursing notes.  Pertinent labs & imaging results that were available during my care of the patient were reviewed by me and considered in my medical decision making (see chart for details).     Patient is very well-appearing 31 year old female presenting with nausea vomiting and diarrhea. This happened after eating a pizza last night. Patient had streaks of blood. This is likely Mallory weiss vsesophageal irritation. Patient is given Zofran and then has eaten an  container chinese food since then. Patient feels back to normal. Patient has no abdominal tenderness. She has normal vital signs and physical exam. We will discharge home with Zofran, likely viral gastroenteritis versus food borne illness.  Final Clinical Impressions(s) / ED Diagnoses   Final diagnoses:  None    New Prescriptions New Prescriptions   No medications on file     Daianna Vasques Randall An, MD 11/17/16 2011

## 2016-11-17 NOTE — Discharge Instructions (Signed)
Please take this mediciation to help with your nausea. Please return if they have any abdominal pain or other concerns.

## 2016-11-17 NOTE — ED Triage Notes (Signed)
Pt complaining of nausea and abdominal pain since yesterday. Pt states emesis x 4 today. Pt states noticed some blood in emesis. Pt states 8 episodes of diarrhea. Pt denies any urinary symptoms, pt denies any vaginal bleeding or discharge.

## 2017-03-23 ENCOUNTER — Emergency Department: Payer: Self-pay

## 2017-03-23 ENCOUNTER — Emergency Department
Admission: EM | Admit: 2017-03-23 | Discharge: 2017-03-23 | Disposition: A | Discharge status: Home / Self Care | Payer: Self-pay | Attending: Student in an Organized Health Care Education/Training Program | Admitting: Student in an Organized Health Care Education/Training Program

## 2017-03-23 DIAGNOSIS — Z79899 Other long term (current) drug therapy: Secondary | ICD-10-CM | POA: Insufficient documentation

## 2017-03-23 DIAGNOSIS — J069 Acute upper respiratory infection, unspecified: Secondary | ICD-10-CM | POA: Insufficient documentation

## 2017-03-23 LAB — POCT RAPID STREP A: STREPTOCOCCUS, GROUP A SCREEN (DIRECT): NEGATIVE

## 2017-03-23 NOTE — ED Triage Notes (Signed)
Pt reports sore throat, cough and nasal congestion for four days.

## 2017-03-23 NOTE — ED Provider Notes (Signed)
[facility] Emergency Department Provider Note  ____________________________________________  Time seen: Approximately 3:36 PM  I have reviewed the triage vital signs and the nursing notes.   HISTORY  Chief Complaint Sore Throat and Cough    HPI Jordan Mathis is a 31 y.o. female presenting to the emergency department with rhinorrhea, congestion, diarrhea, nonproductive cough, pharyngitis low-grade fever and fatigue for approximately 1 week. Temperature has been as high as 100.54F assessed orally. 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 Patient is tolerating fluids by mouth she has had diminished appetite and increased sleep.patient denies associated chest pain, chest tightness, shortness of breath, nausea, vomiting. No alleviating measures up and attempted. Patient sought care at urgent care yesterday and was diagnosed with a viral URI.   No past medical history on file.  There are no active problems to display for this patient.   Past Surgical History:  Procedure Laterality Date  . CESAREAN SECTION      Prior to Admission medications   Medication Sig Start Date End Date Taking? Authorizing Provider  acetaminophen (TYLENOL) 500 MG tablet Take 2 tablets (1,000 mg total) by mouth every 6 (six) hours as needed. 08/21/16   Cheri Fowlerose, Kayla, PA-C  butalbital-acetaminophen-caffeine (FIORICET, ESGIC) [phone] MG tablet Take 1-2 tablets by mouth every 6 (six) hours as needed for headache. 07/08/16   Beers, Charmayne Sheerharles M, PA-C  cetirizine (ZYRTEC) 10 MG tablet Take 1 tablet (10 mg total) by mouth daily. 08/21/16   Cheri Fowlerose, Kayla, PA-C  ibuprofen (ADVIL,MOTRIN) 800 MG tablet Take 1 tablet (800 mg total) by mouth 3 (three) times daily. 08/21/16   Cheri Fowlerose, Kayla, PA-C  methocarbamol (ROBAXIN) 500 MG tablet Take 1 tablet (500 mg total) by mouth every 6 (six) hours as needed for muscle spasms. 07/08/16   Beers, Charmayne Sheerharles M, PA-C  ondansetron (ZOFRAN) 4 MG tablet  Take 1 tablet (4 mg total) by mouth every 8 (eight) hours as needed for nausea or vomiting. 11/17/16   Mackuen, Cindee Saltourteney Lyn, MD    Allergies Amoxicillin  No family history on file.  Social History Social History  Substance Use Topics  . Smoking status: Never Smoker  . Smokeless tobacco: Never Used  . Alcohol use Yes     Comment: occasionally     Review of Systems  Constitutional: Patient has had fever.  Eyes: No visual changes. No discharge ENT: Patient has had congestion.  Cardiovascular: no chest pain. Respiratory: Patient has had non-productive cough.  No SOB. Gastrointestinal: Patient has had nausea.  Genitourinary: Negative for dysuria. No hematuria Musculoskeletal: Patient has had myalgias. Skin: Negative for rash, abrasions, lacerations, ecchymosis. Neurological: Negative for headaches, focal weakness or numbness.   ____________________________________________   PHYSICAL EXAM:  VITAL SIGNS: ED Triage Vitals  Enc Vitals Group     BP 03/23/17 1410 139/90     Pulse Rate 03/23/17 1410 90     Resp 03/23/17 1410 18     Temp 03/23/17 1410 98.3 F (36.8 C)     Temp Source 03/23/17 1410 Oral     SpO2 03/23/17 1410 99 %     Weight 03/23/17 1411 168 lb (76.2 kg)     Height 03/23/17 1411 5\' 3"  (1.6 m)     Head Circumference --      Peak Flow --      Pain Score 03/23/17 1410 6     Pain Loc --      Pain Edu? --      Excl.  in GC? --     Constitutional: Alert and oriented. Patient is lying supine in bed.  Eyes: Conjunctivae are normal. PERRL. EOMI. Head: Atraumatic. ENT:      Ears: Tympanic membranes are injected bilaterally without evidence of effusion or purulent exudate. Bony landmarks are visualized bilaterally. No pain with palpation at the tragus.      Nose: Nasal turbinates are edematous and erythematous. Copious rhinorrhea visualized.      Mouth/Throat: Mucous membranes are moist. Posterior pharynx is mildly erythematous. No tonsillar hypertrophy or  purulent exudate. Uvula is midline. Neck: Full range of motion. No pain is elicited with flexion at the neck. Hematological/Lymphatic/Immunilogical: No cervical lymphadenopathy. Cardiovascular: Normal rate, regular rhythm. Normal S1 and S2.  Good peripheral circulation. Respiratory: Normal respiratory effort without tachypnea or retractions. Lungs CTAB. Good air entry to the bases with no decreased or absent breath sounds. Gastrointestinal: Bowel sounds 4 quadrants. Soft and nontender to palpation. No guarding or rigidity. No palpable masses. No distention. No CVA tenderness.  Skin:  Skin is warm, dry and intact. No rash noted. Psychiatric: Mood and affect are normal. Speech and behavior are normal. Patient exhibits appropriate insight and judgement.  ____________________________________________   LABS (all labs ordered are listed, but only abnormal results are displayed)  Labs Reviewed  POCT RAPID STREP A   ____________________________________________  EKG   ____________________________________________  RADIOLOGY Geraldo Pitter, personally viewed and evaluated these images (plain radiographs) as part of my medical decision making, as well as reviewing the written report by the radiologist.    Dg Chest 2 View  Result Date: 03/23/2017 CLINICAL DATA:  Productive cough, chest pain. EXAM: CHEST  2 VIEW COMPARISON:  Radiographs of June 02, 2010. FINDINGS: The heart size and mediastinal contours are within normal limits. Both lungs are clear. No pneumothorax or pleural effusion is noted. The visualized skeletal structures are unremarkable. IMPRESSION: No active cardiopulmonary disease. Electronically Signed   By: Lupita Raider, M.D.   On: 03/23/2017 15:55    ____________________________________________    PROCEDURES  Procedure(s) performed:    Procedures    Medications - No data to display   ____________________________________________   INITIAL IMPRESSION /  ASSESSMENT AND PLAN / ED COURSE  Pertinent labs & imaging results that were available during my care of the patient were reviewed by me and considered in my medical decision making (see chart for details).  Review of the [place] CSRS was performed in accordance of the [facility] prior to dispensing any controlled drugs.    Assessment and plan Viral upper respiratory tract infection Presents to the emergency department with rhinorrhea, congestion, diarrhea, nonproductive cough, pharyngitis low-grade fever and fatigue for the past week. DG chest revealed no consolidations or findings consistent with pneumonia. Patient tested negative for strep in the emergency department. History and physical exam findings are consistent with viral upper respiratory tract infection. Rest and hydration were encouraged. Vital signs were reassuring prior to discharge. Patient was advised to follow-up with primary care as needed for follow-up. All patient questions were answered.   ____________________________________________  FINAL CLINICAL IMPRESSION(S) / ED DIAGNOSES  Final diagnoses:  Viral upper respiratory tract infection      NEW MEDICATIONS STARTED DURING THIS VISIT:  New Prescriptions   No medications on file        This chart was dictated using voice recognition software/Dragon. Despite best efforts to proofread, errors can occur which can change the meaning. Any change was purely unintentional.    Pia Mau  M, PA-C 03/23/17 1636    Willy Eddy, MD 03/23/17 [phone]

## 2017-03-23 NOTE — ED Notes (Signed)
Pt presents with fever, aches x 1 week. States started out as aches and diarrhea with intermittent fever. Pt states on Sunday she had fever, aches, and sore throat; now c/o cough and night sweats, extreme fatigue. Pt saw pcp yesterday for sore throat (strep negative) and had fever at the time, but provider didn't seem to be concerned. Pt alert & oriented; on telephone when this nurse entered room.

## 2017-03-23 NOTE — ED Notes (Signed)
Pt discharged home after verbalizing understanding of discharge instructions; nad noted. 

## 2018-10-18 ENCOUNTER — Emergency Department
Admission: EM | Admit: 2018-10-18 | Discharge: 2018-10-18 | Disposition: A | Discharge status: Home / Self Care | Payer: Self-pay | Attending: Emergency Medicine | Admitting: Emergency Medicine

## 2018-10-18 ENCOUNTER — Other Ambulatory Visit: Payer: Self-pay

## 2018-10-18 DIAGNOSIS — H60392 Other infective otitis externa, left ear: Secondary | ICD-10-CM

## 2018-10-18 DIAGNOSIS — Z79899 Other long term (current) drug therapy: Secondary | ICD-10-CM | POA: Insufficient documentation

## 2018-10-18 MED ORDER — NEOMYCIN-POLYMYXIN-HC 3.5-10000-1 OT SOLN: 3.0000 [drp] | Freq: Three times a day (TID) | OTIC | 0 refills | Status: AC

## 2018-10-18 MED ORDER — NEOMYCIN-POLYMYXIN-HC 3.5-10000-1 OT SOLN
4.0000 [drp] | Freq: Once | OTIC | Status: AC
  Administered 2018-10-18: 4 [drp] via OTIC
  Filled 2018-10-18: qty 10

## 2018-10-18 NOTE — ED Provider Notes (Signed)
[facility] Emergency Department Provider Note  ____________________________________________  Time seen: Approximately 7:30 PM  I have reviewed the triage vital signs and the nursing notes.   HISTORY  Chief Complaint Otalgia    HPI Jordan Mathis is a 33 y.o. female who presents the emergency department complaining of 2 to 3-day history of left ear pain and drainage.  Patient reports that she has pain to the left ear and worsening pain with palpation or sleeping on the left side.  Patient denies any trauma to the ear.  She has not put anything in her ear.  No loud sounds or explosions.  No history of same in the past.  No fevers or chills, nasal congestion, sore throat or cough.  No medicines for his complaint prior to arrival.    History reviewed. No pertinent past medical history.  There are no active problems to display for this patient.   Past Surgical History:  Procedure Laterality Date  . CESAREAN SECTION      Prior to Admission medications   Medication Sig Start Date End Date Taking? Authorizing Provider  acetaminophen (TYLENOL) 500 MG tablet Take 2 tablets (1,000 mg total) by mouth every 6 (six) hours as needed. 08/21/16   Cheri Fowlerose, Kayla, PA-C  butalbital-acetaminophen-caffeine (FIORICET, ESGIC) [id number] MG tablet Take 1-2 tablets by mouth every 6 (six) hours as needed for headache. 07/08/16   Beers, Charmayne Sheerharles M, PA-C  cetirizine (ZYRTEC) 10 MG tablet Take 1 tablet (10 mg total) by mouth daily. 08/21/16   Cheri Fowlerose, Kayla, PA-C  ibuprofen (ADVIL,MOTRIN) 800 MG tablet Take 1 tablet (800 mg total) by mouth 3 (three) times daily. 08/21/16   Cheri Fowlerose, Kayla, PA-C  methocarbamol (ROBAXIN) 500 MG tablet Take 1 tablet (500 mg total) by mouth every 6 (six) hours as needed for muscle spasms. 07/08/16   Beers, Charmayne Sheerharles M, PA-C  neomycin-polymyxin-hydrocortisone (CORTISPORIN) OTIC solution Place 3 drops into the left ear 3 (three) times daily for 10 days. 10/18/18 10/28/18   Johnasia Liese, Delorise RoyalsJonathan D, PA-C  ondansetron (ZOFRAN) 4 MG tablet Take 1 tablet (4 mg total) by mouth every 8 (eight) hours as needed for nausea or vomiting. 11/17/16   Mackuen, Cindee Saltourteney Lyn, MD    Allergies Amoxicillin  No family history on file.  Social History Social History   Tobacco Use  . Smoking status: Never Smoker  . Smokeless tobacco: Never Used  Substance Use Topics  . Alcohol use: Yes    Comment: occasionally  . Drug use: Never     Review of Systems  Constitutional: No fever/chills Eyes: No visual changes. No discharge ENT: Positive for left ear pain Cardiovascular: no chest pain. Respiratory: no cough. No SOB. Gastrointestinal: No abdominal pain.  No nausea, no vomiting.   Musculoskeletal: Negative for musculoskeletal pain. Skin: Negative for rash, abrasions, lacerations, ecchymosis. Neurological: Negative for headaches, focal weakness or numbness. 10-point ROS otherwise negative.  ____________________________________________   PHYSICAL EXAM:  VITAL SIGNS: ED Triage Vitals  Enc Vitals Group     BP 10/18/18 1816 (!) 165/100     Pulse Rate 10/18/18 1816 98     Resp 10/18/18 1816 16     Temp 10/18/18 1816 98.4 F (36.9 C)     Temp Source 10/18/18 1816 Oral     SpO2 10/18/18 1816 100 %     Weight 10/18/18 1817 178 lb (80.7 kg)     Height 10/18/18 1817 5\' 3"  (1.6 m)     Head Circumference --  Peak Flow --      Pain Score 10/18/18 1816 8     Pain Loc --      Pain Edu? --      Excl. in GC? --      Constitutional: Alert and oriented. Well appearing and in no acute distress. Eyes: Conjunctivae are normal. PERRL. EOMI. Head: Atraumatic. ENT:      Ears: EAC and TM on right is unremarkable.  EAC on left is edematous, erythematous, purulent drainage noted.  TM is not well visualized but no indication of bulging or injection.  No indication of tympanic membrane perforation      Nose: No congestion/rhinnorhea.      Mouth/Throat: Mucous membranes are  moist.  Neck: No stridor.    Cardiovascular: Normal rate, regular rhythm. Normal S1 and S2.  Good peripheral circulation. Respiratory: Normal respiratory effort without tachypnea or retractions. Lungs CTAB. Good air entry to the bases with no decreased or absent breath sounds. Musculoskeletal: Full range of motion to all extremities. No gross deformities appreciated. Neurologic:  Normal speech and language. No gross focal neurologic deficits are appreciated.  Skin:  Skin is warm, dry and intact. No rash noted. Psychiatric: Mood and affect are normal. Speech and behavior are normal. Patient exhibits appropriate insight and judgement.   ____________________________________________   LABS (all labs ordered are listed, but only abnormal results are displayed)  Labs Reviewed - No data to display ____________________________________________  EKG   ____________________________________________  RADIOLOGY   No results found.  ____________________________________________    PROCEDURES  Procedure(s) performed:    Procedures    Medications  NEOMYCIN-POLYMYXIN-HYDROCORTISONE (CORTISPORIN) OTIC (EAR) solution 4 drop (has no administration in time range)     ____________________________________________   INITIAL IMPRESSION / ASSESSMENT AND PLAN / ED COURSE  Pertinent labs & imaging results that were available during my care of the patient were reviewed by me and considered in my medical decision making (see chart for details).  Review of the [place] CSRS was performed in accordance of the [facility] prior to dispensing any controlled drugs.      Patient's diagnosis is consistent with otitis externa.  Patient presents the emergency department complaining of left ear pain, drainage from the left ear for 2 to 3 days.  Patient reports that it does hurt with palpation.  Findings were consistent with otitis externa on exam.  Differential included perforated TM, external auditory canal  trauma, otitis media, otitis externa.. Patient will be discharged home with prescriptions for Cortisporin eardrops. Patient is to follow up with primary care as needed or otherwise directed. Patient is given ED precautions to return to the ED for any worsening or new symptoms.     ____________________________________________  FINAL CLINICAL IMPRESSION(S) / ED DIAGNOSES  Final diagnoses:  Other infective acute otitis externa of left ear      NEW MEDICATIONS STARTED DURING THIS VISIT:  ED Discharge Orders         Ordered    neomycin-polymyxin-hydrocortisone (CORTISPORIN) OTIC solution  3 times daily     10/18/18 1939              This chart was dictated using voice recognition software/Dragon. Despite best efforts to proofread, errors can occur which can change the meaning. Any change was purely unintentional.    Racheal PatchesCuthriell, Meldrick Buttery D, PA-C 10/18/18 1943    Minna AntisPaduchowski, Kevin, MD 10/18/18 2203

## 2018-10-18 NOTE — ED Triage Notes (Signed)
Pt c/o left ear pain x3 days - her telephysician advised her to come to the ED for eval - she reports no relief from OTC meds - denies cough, congestion, runny nose, headache

## 2018-10-18 NOTE — ED Notes (Signed)
See triage note  Presents with a 3 day hx of ear pain  Pain is mainly pain is on the left  No fever

## 2018-11-13 ENCOUNTER — Other Ambulatory Visit: Payer: Self-pay

## 2018-11-13 ENCOUNTER — Encounter (HOSPITAL_COMMUNITY): Payer: Self-pay | Admitting: Emergency Medicine

## 2018-11-13 ENCOUNTER — Ambulatory Visit (HOSPITAL_COMMUNITY)
Admission: EM | Admit: 2018-11-13 | Discharge: 2018-11-13 | Disposition: A | Discharge status: Home / Self Care | Payer: 59 | Attending: Family Medicine | Admitting: Family Medicine

## 2018-11-13 DIAGNOSIS — R070 Pain in throat: Secondary | ICD-10-CM

## 2018-11-13 DIAGNOSIS — J111 Influenza due to unidentified influenza virus with other respiratory manifestations: Secondary | ICD-10-CM

## 2018-11-13 DIAGNOSIS — R059 Cough, unspecified: Secondary | ICD-10-CM

## 2018-11-13 DIAGNOSIS — R69 Illness, unspecified: Secondary | ICD-10-CM | POA: Diagnosis not present

## 2018-11-13 DIAGNOSIS — R05 Cough: Secondary | ICD-10-CM | POA: Insufficient documentation

## 2018-11-13 MED ORDER — BENZONATATE 100 MG PO CAPS: 100.0000 mg | ORAL_CAPSULE | Freq: Three times a day (TID) | ORAL | 0 refills | Status: DC | PRN

## 2018-11-13 MED ORDER — OSELTAMIVIR PHOSPHATE 75 MG PO CAPS: 75.0000 mg | ORAL_CAPSULE | Freq: Two times a day (BID) | ORAL | 0 refills | Status: DC

## 2018-11-13 MED ORDER — ACETAMINOPHEN 325 MG PO TABS
650.0000 mg | ORAL_TABLET | Freq: Once | ORAL | Status: AC
  Administered 2018-11-13: 650 mg via ORAL

## 2018-11-13 MED ORDER — ACETAMINOPHEN 325 MG PO TABS
ORAL_TABLET | ORAL | Status: AC
  Filled 2018-11-13: qty 2

## 2018-11-13 NOTE — ED Triage Notes (Signed)
Pt reports nasal congestion, cough, sore throat, abdominal pain that started on Saturday.  Pt also reports a fever of 101.2 today at work.

## 2018-11-13 NOTE — Discharge Instructions (Addendum)
We will manage this as a viral syndrome. For sore throat or cough try using a honey-based tea. Use 3 teaspoons of honey with juice squeezed from half lemon. Place shaved pieces of ginger into 1/2-1 cup of water and warm over stove top. Then mix the ingredients and repeat every 4 hours as needed. Please take ibuprofen 400mg every 6 hours alternating with OR taken together with Tylenol 500mg every 6 hours. Hydrate very well with at least 2 liters of water. Eat light meals such as soups to replenish electrolytes and soft fruits, veggies. Start an antihistamine like Zyrtec, Allegra or Claritin. You may pick up over-the-counter pseudoephedrine (Sudafed) and use this for post-nasal drainage, sinus congestion at a dose of 60mg every 8 hours or 120mg every 12 hours as needed. ° °

## 2018-11-13 NOTE — ED Provider Notes (Signed)
MRN: [id number] DOB: [birth year]  Subjective:   Jordan Mathis is a 33 y.o. female presenting for 2-day history of moderate to severe, worsening, constant body aches and malaise. Has tried APAP with some relief.    Current Facility-Administered Medications:  .  acetaminophen (TYLENOL) tablet 650 mg, 650 mg, Oral, Once, Elvina SidleLauenstein, Kurt, MD  Current Outpatient Medications:  .  acetaminophen (TYLENOL) 500 MG tablet, Take 2 tablets (1,000 mg total) by mouth every 6 (six) hours as needed., Disp: 30 tablet, Rfl: 0 .  butalbital-acetaminophen-caffeine (FIORICET, ESGIC) 50-325-40 MG tablet, Take 1-2 tablets by mouth every 6 (six) hours as needed for headache., Disp: 20 tablet, Rfl: 0 .  cetirizine (ZYRTEC) 10 MG tablet, Take 1 tablet (10 mg total) by mouth daily., Disp: 30 tablet, Rfl: 0 .  ibuprofen (ADVIL,MOTRIN) 800 MG tablet, Take 1 tablet (800 mg total) by mouth 3 (three) times daily., Disp: 21 tablet, Rfl: 0 .  methocarbamol (ROBAXIN) 500 MG tablet, Take 1 tablet (500 mg total) by mouth every 6 (six) hours as needed for muscle spasms., Disp: 30 tablet, Rfl: 0 .  ondansetron (ZOFRAN) 4 MG tablet, Take 1 tablet (4 mg total) by mouth every 8 (eight) hours as needed for nausea or vomiting., Disp: 11 tablet, Rfl: 0    Allergies  Allergen Reactions  . Amoxicillin Hives    Denies chronic conditions.    Past Surgical History:  Procedure Laterality Date  . CESAREAN SECTION      Review of Systems  Constitutional: Positive for chills, fever and malaise/fatigue.  HENT: Positive for congestion and sore throat. Negative for ear pain.   Eyes: Negative for blurred vision and double vision.  Respiratory: Positive for cough. Negative for shortness of breath and wheezing.   Cardiovascular: Negative for chest pain.  Gastrointestinal: Positive for abdominal pain. Negative for nausea and vomiting.  Genitourinary: Negative for dysuria and hematuria.  Musculoskeletal: Positive for myalgias.   Skin: Negative for rash.  Neurological: Positive for headaches. Negative for dizziness.  Psychiatric/Behavioral: Negative for depression. The patient is not nervous/anxious.     Objective:   Vitals: BP (!) 138/92 (BP Location: Left Arm)   Pulse (!) 116   Temp (!) 101.1 F (38.4 C) (Oral)   LMP 11/01/2018 (Exact Date)   SpO2 100%   Physical Exam Constitutional:      General: She is not in acute distress.    Appearance: Normal appearance. She is well-developed. She is not ill-appearing, toxic-appearing or diaphoretic.  HENT:     Head: Normocephalic and atraumatic.     Right Ear: Tympanic membrane and ear canal normal. No drainage or tenderness. No middle ear effusion. Tympanic membrane is not erythematous.     Left Ear: Tympanic membrane and ear canal normal. No drainage or tenderness.  No middle ear effusion. Tympanic membrane is not erythematous.     Nose: Nose normal. No congestion or rhinorrhea.     Mouth/Throat:     Mouth: Mucous membranes are moist. No oral lesions.     Pharynx: Oropharynx is clear. No pharyngeal swelling, oropharyngeal exudate, posterior oropharyngeal erythema or uvula swelling.     Tonsils: No tonsillar exudate or tonsillar abscesses.  Eyes:     General: No scleral icterus.       Right eye: No discharge.        Left eye: No discharge.     Extraocular Movements: Extraocular movements intact.     Right eye: Normal extraocular motion.     Left eye: Normal  extraocular motion.     Conjunctiva/sclera: Conjunctivae normal.     Pupils: Pupils are equal, round, and reactive to light.  Neck:     Musculoskeletal: Normal range of motion and neck supple. No neck rigidity or muscular tenderness.  Cardiovascular:     Rate and Rhythm: Normal rate and regular rhythm.     Pulses: Normal pulses.     Heart sounds: Normal heart sounds. No murmur. No friction rub. No gallop.   Pulmonary:     Effort: Pulmonary effort is normal. No respiratory distress.     Breath  sounds: Normal breath sounds. No stridor. No wheezing, rhonchi or rales.  Lymphadenopathy:     Cervical: No cervical adenopathy.  Skin:    General: Skin is warm and dry.     Findings: No rash.  Neurological:     General: No focal deficit present.     Mental Status: She is alert and oriented to person, place, and time.  Psychiatric:        Mood and Affect: Mood normal.        Behavior: Behavior normal.        Thought Content: Thought content normal.    Assessment and Plan :   Influenza-like illness  Cough  Throat pain  Will cover for influenza with Tamiflu given symptom set, acute onset, physical exam findings.  Use supportive care, rest, fluids, hydration, light meals, schedule Tylenol and ibuprofen. Counseled patient on potential for adverse effects with medications prescribed today, patient verbalized understanding. ER and return-to-clinic precautions discussed, patient verbalized understanding.   Wallis BambergMani, [place] Duerst, [place] JerseyPA-C 11/13/18 1433

## 2019-08-07 ENCOUNTER — Other Ambulatory Visit: Payer: Self-pay

## 2019-08-07 ENCOUNTER — Emergency Department: Payer: No Typology Code available for payment source

## 2019-08-07 ENCOUNTER — Emergency Department
Admission: EM | Admit: 2019-08-07 | Discharge: 2019-08-07 | Disposition: A | Discharge status: Home / Self Care | Payer: No Typology Code available for payment source | Attending: Emergency Medicine | Admitting: Emergency Medicine

## 2019-08-07 DIAGNOSIS — S0083XA Contusion of other part of head, initial encounter: Secondary | ICD-10-CM | POA: Diagnosis not present

## 2019-08-07 DIAGNOSIS — Y999 Unspecified external cause status: Secondary | ICD-10-CM | POA: Insufficient documentation

## 2019-08-07 DIAGNOSIS — M79602 Pain in left arm: Secondary | ICD-10-CM | POA: Diagnosis not present

## 2019-08-07 DIAGNOSIS — Z79899 Other long term (current) drug therapy: Secondary | ICD-10-CM | POA: Diagnosis not present

## 2019-08-07 DIAGNOSIS — Y939 Activity, unspecified: Secondary | ICD-10-CM | POA: Insufficient documentation

## 2019-08-07 DIAGNOSIS — G44319 Acute post-traumatic headache, not intractable: Secondary | ICD-10-CM | POA: Diagnosis not present

## 2019-08-07 DIAGNOSIS — Y929 Unspecified place or not applicable: Secondary | ICD-10-CM | POA: Insufficient documentation

## 2019-08-07 DIAGNOSIS — S161XXA Strain of muscle, fascia and tendon at neck level, initial encounter: Secondary | ICD-10-CM

## 2019-08-07 DIAGNOSIS — S0990XA Unspecified injury of head, initial encounter: Secondary | ICD-10-CM | POA: Diagnosis present

## 2019-08-07 MED ORDER — METHOCARBAMOL 500 MG PO TABS: 500.0000 mg | ORAL_TABLET | Freq: Four times a day (QID) | ORAL | 0 refills | Status: DC | PRN

## 2019-08-07 MED ORDER — NAPROXEN 500 MG PO TABS: 500.0000 mg | ORAL_TABLET | Freq: Two times a day (BID) | ORAL | 0 refills | Status: DC

## 2019-08-07 MED ORDER — HYDROCODONE-ACETAMINOPHEN 5-325 MG PO TABS: 1.0000 | ORAL_TABLET | Freq: Four times a day (QID) | ORAL | 0 refills | Status: DC | PRN
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
## 2019-08-07 NOTE — Discharge Instructions (Signed)
Follow-up with your primary care provider if any continued problems or additional pain medication as needed.  You may use ice or heat to your muscles as needed for discomfort.  A muscle relaxant, anti-inflammatory and pain medication was sent to your pharmacy.  Do not take the muscle relaxant and pain medication if you plan on driving or operating machinery.  Even with medication you can expect to be sore for approximately 4 days.  Return to the emergency department if any severe worsening of your symptoms or urgent concerns.
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
## 2019-09-19 ENCOUNTER — Ambulatory Visit: Payer: Self-pay | Admitting: Adult Health Nurse Practitioner

## 2019-09-28 ENCOUNTER — Ambulatory Visit: Payer: Self-pay | Admitting: Adult Health Nurse Practitioner

## 2019-10-26 ENCOUNTER — Ambulatory Visit: Payer: Self-pay | Admitting: Registered Nurse

## 2019-10-29 ENCOUNTER — Encounter: Payer: Self-pay | Admitting: Registered Nurse

## 2020-04-17 ENCOUNTER — Telehealth: Payer: Self-pay | Admitting: Obstetrics & Gynecology

## 2020-04-17 NOTE — Telephone Encounter (Signed)
We have received records from [facility] on this patient. I called and left voicemail for patient to call back to be scheduled

## 2020-05-01 ENCOUNTER — Encounter: Payer: 59 | Admitting: Obstetrics & Gynecology

## 2020-05-01 ENCOUNTER — Other Ambulatory Visit: Payer: Self-pay

## 2020-05-06 ENCOUNTER — Ambulatory Visit (INDEPENDENT_AMBULATORY_CARE_PROVIDER_SITE_OTHER): Payer: Medicaid Other | Admitting: Obstetrics and Gynecology

## 2020-05-06 ENCOUNTER — Encounter: Payer: Self-pay | Admitting: Obstetrics and Gynecology

## 2020-05-06 ENCOUNTER — Other Ambulatory Visit (HOSPITAL_COMMUNITY)
Admission: RE | Admit: 2020-05-06 | Discharge: 2020-05-06 | Disposition: A | Discharge status: Home / Self Care | Payer: 59 | Source: Ambulatory Visit | Attending: Obstetrics and Gynecology | Admitting: Obstetrics and Gynecology

## 2020-05-06 ENCOUNTER — Other Ambulatory Visit: Payer: Self-pay

## 2020-05-06 VITALS — BP 118/70 | Ht 63.0 in | Wt 190.0 lb

## 2020-05-06 DIAGNOSIS — Z113 Encounter for screening for infections with a predominantly sexual mode of transmission: Secondary | ICD-10-CM

## 2020-05-06 DIAGNOSIS — Z30011 Encounter for initial prescription of contraceptive pills: Secondary | ICD-10-CM | POA: Diagnosis not present

## 2020-05-06 DIAGNOSIS — Z124 Encounter for screening for malignant neoplasm of cervix: Secondary | ICD-10-CM | POA: Insufficient documentation

## 2020-05-06 DIAGNOSIS — Z1151 Encounter for screening for human papillomavirus (HPV): Secondary | ICD-10-CM

## 2020-05-06 DIAGNOSIS — Z3046 Encounter for surveillance of implantable subdermal contraceptive: Secondary | ICD-10-CM | POA: Diagnosis not present

## 2020-05-06 DIAGNOSIS — N921 Excessive and frequent menstruation with irregular cycle: Secondary | ICD-10-CM | POA: Diagnosis not present

## 2020-05-06 DIAGNOSIS — R102 Pelvic and perineal pain: Secondary | ICD-10-CM

## 2020-05-06 MED ORDER — MICROGESTIN 24 FE 1-20 MG-MCG PO TABS: 1.0000 | ORAL_TABLET | Freq: Every day | ORAL | 1 refills | Status: DC

## 2020-05-06 NOTE — Progress Notes (Signed)
[facility]   Chief Complaint  Patient presents with  . Menorrhagia    lots of clots, severe cramping x 2-3 months    HPI:      Ms. Jordan Mathis is a 34 y.o. No obstetric history on file. whose LMP was Patient's last menstrual period was 04/22/2020 (approximate)., presents today for NP eval of heavy bleeding and pelvic pain with nexplanon, referred by [facility]. Nexplanon REplaced 11/20. Had monthly bleeding lasting 7 days, mod flow with first one. Has had irregular bleeding since 2nd one placed, but has been having problems with 2-3 wks of heavy bleeding and clots for the past 2-3 months, changing products hourly. Has dysmen, somewhat improved with tylenol. Also having severe random, non-menstrual cramps/pain, similar to labor, not improved with tylenol. No vag sx, urin sx, GI sx, fevers. Has had LT flank pain radiating to LT low back. No recent GYN u/s done.   She is sex active, no new partners. Having pain with sex past couple of months, no bleeding. Not usual for pt. No hx of STDs.   Pt would like nexplanon removed and change to OCPs. Did pills in past with late/missed pills when younger and 2 unexpected pregnancies. Feels like she can do daily pill now. No hx of HTN, DVTs, migraines with aura.   Unsure of last pap date. No hx of abn.   Past Medical History:  Diagnosis Date  . GERD (gastroesophageal reflux disease)   . Pre-eclampsia     Past Surgical History:  Procedure Laterality Date  . CESAREAN SECTION      Family History  Problem Relation Age of Onset  . Hypertension Mother 59  . Colon cancer Maternal Grandmother     Social History   Socioeconomic History  . Marital status: Single    Spouse name: Not on file  . Number of children: Not on file  . Years of education: Not on file  . Highest education level: Not on file  Occupational History  . Not on file  Tobacco Use  . Smoking status: Never Smoker  . Smokeless  tobacco: Never Used  Vaping Use  . Vaping Use: Never used  Substance and Sexual Activity  . Alcohol use: Yes    Comment: occasionally  . Drug use: Never  . Sexual activity: Yes    Birth control/protection: Implant  Other Topics Concern  . Not on file  Social History Narrative  . Not on file   Social Determinants of Health   Financial Resource Strain:   . Difficulty of Paying Living Expenses:   Food Insecurity:   . Worried About Programme researcher, broadcasting/film/video in the Last Year:   . Barista in the Last Year:   Transportation Needs:   . Freight forwarder (Medical):   Marland Kitchen Lack of Transportation (Non-Medical):   Physical Activity:   . Days of Exercise per Week:   . Minutes of Exercise per Session:   Stress:   . Feeling of Stress :   Social Connections:   . Frequency of Communication with Friends and Family:   . Frequency of Social Gatherings with Friends and Family:   . Attends Religious Services:   . Active Member of Clubs or Organizations:   . Attends Banker Meetings:   Marland Kitchen Marital Status:   Intimate Partner Violence:   . Fear of Current or Ex-Partner:   . Emotionally Abused:   Marland Kitchen Physically Abused:   .  Sexually Abused:     Outpatient Medications Prior to Visit  Medication Sig Dispense Refill  . etonogestrel (NEXPLANON) 68 MG IMPL implant 1 each by Subdermal route once. Nov 2020    . acetaminophen (TYLENOL) 500 MG tablet Take 2 tablets (1,000 mg total) by mouth every 6 (six) hours as needed. 30 tablet 0  . butalbital-acetaminophen-caffeine (FIORICET, ESGIC) 50-325-40 MG tablet Take 1-2 tablets by mouth every 6 (six) hours as needed for headache. 20 tablet 0  . cetirizine (ZYRTEC) 10 MG tablet Take 1 tablet (10 mg total) by mouth daily. 30 tablet 0  . HYDROcodone-acetaminophen (NORCO/VICODIN) 5-325 MG tablet Take 1 tablet by mouth every 6 (six) hours as needed for moderate pain. 15 tablet 0  . methocarbamol (ROBAXIN) 500 MG tablet Take 1 tablet (500 mg total)  by mouth every 6 (six) hours as needed. 15 tablet 0  . naproxen (NAPROSYN) 500 MG tablet Take 1 tablet (500 mg total) by mouth 2 (two) times daily with a meal. 20 tablet 0   No facility-administered medications prior to visit.      ROS:  Review of Systems  Constitutional: Negative for fever.  Gastrointestinal: Negative for blood in stool, constipation, diarrhea, nausea and vomiting.  Genitourinary: Positive for dyspareunia, menstrual problem and pelvic pain. Negative for dysuria, flank pain, frequency, hematuria, urgency, vaginal bleeding, vaginal discharge and vaginal pain.  Musculoskeletal: Positive for back pain.  Skin: Negative for rash.    OBJECTIVE:   Vitals:  BP 118/70   Ht 5\' 3"  (1.6 m)   Wt 190 lb (86.2 kg)   LMP 04/22/2020 (Approximate)   BMI 33.66 kg/m   Physical Exam Vitals reviewed.  Constitutional:      Appearance: She is well-developed.  Pulmonary:     Effort: Pulmonary effort is normal.  Abdominal:     Palpations: Abdomen is soft.     Tenderness: There is no abdominal tenderness. There is no guarding or rebound.  Genitourinary:    General: Normal vulva.     Pubic Area: No rash.      Labia:        Right: No rash, tenderness or lesion.        Left: No rash, tenderness or lesion.      Vagina: Normal. No vaginal discharge, erythema or tenderness.     Cervix: Normal.     Uterus: Normal. Tender. Not enlarged.      Adnexa:        Right: Tenderness present. No mass.         Left: Tenderness present. No mass.       Comments: NO CMT Musculoskeletal:        General: Normal range of motion.     Cervical back: Normal range of motion.  Skin:    General: Skin is warm and dry.  Neurological:     General: No focal deficit present.     Mental Status: She is alert and oriented to person, place, and time.  Psychiatric:        Mood and Affect: Mood normal.        Behavior: Behavior normal.        Thought Content: Thought content normal.        Judgment:  Judgment normal.     Nexplanon removal Procedure note - The Nexplanon was noted in the patient's arm and the end was identified. The skin was cleansed with a Betadine solution. A small injection of subcutaneous lidocaine with epinephrine was given over the  end of the implant. An incision was made at the end of the implant. The rod was noted in the incision and grasped with a hemostat. It was noted to be intact.  Steri-Strip was placed approximating the incision. Hemostasis was noted.   Assessment/Plan: Menometrorrhagia--past few months with nexplanon, no bleeding today. Check STDs. Pt wants removed and taken out today. Discussed GYN u/s but pt prefers to see if sx related to nexplanon. OCP start today. Condoms for 1 wk. RTO in 1 mo for f/u. If cont to have heavy bleeding or pelvic pain, will check GYN u/s.   Pelvic pain/dyspareunia--will check GYN u/s if sx persist.  Nexplanon removal--She was told to remove the dressing in 12-24 hours, to keep the incision area dry for 24 hours and to remove the Steristrip in 2-3  days.  Notify us if any signs of tenderness, redness, pain, or fevers develop.  Encounter for initial prescription of contraceptive pills - Plan: Norethindrone Acetate-Ethinyl Estrad-FE (MICROGESTIN 24 FE) 1-20 MG-MCG(24) tablet; OCP Rx eRxd. Start today, condoms.  Cervical cancer screening - Plan: Cytology - PAP  Screening for HPV (human papillomavirus) - Plan: Cytology - PAP  Screening for STD (sexually transmitted disease) - Plan: Cytology - PAP    Meds ordered this encounter  Medications  . Norethindrone Acetate-Ethinyl Estrad-FE (MICROGESTIN 24 FE) 1-20 MG-MCG(24) tablet    Sig: Take 1 tablet by mouth daily.    Dispense:  28 tablet    Refill:  1    Order Specific Question:   Supervising Provider    Answer:   Nadara Mustard [[id number]]      Return in about 4 weeks (around 06/03/2020) for bleeding f/u.  Emberley Kral B. Harlea Goetzinger, PA-C 05/06/2020 3:18 PM

## 2020-05-06 NOTE — Patient Instructions (Signed)
I value your feedback and entrusting us with your care. If you get a [facility] patient survey, I would appreciate you taking the time to let us know about your experience today. Thank you!  As of September 27, 2019, your lab results will be released to your MyChart immediately, before I even have a chance to see them. Please give me time to review them and contact you if there are any abnormalities. Thank you for your patience.  

## 2020-05-08 LAB — CYTOLOGY - PAP
Chlamydia: NEGATIVE
Comment: NEGATIVE
Comment: NEGATIVE
Comment: NORMAL
Diagnosis: NEGATIVE
Diagnosis: REACTIVE
High risk HPV: NEGATIVE
Neisseria Gonorrhea: NEGATIVE

## 2020-06-03 ENCOUNTER — Ambulatory Visit: Payer: 59 | Admitting: Obstetrics and Gynecology

## 2020-06-03 NOTE — Progress Notes (Deleted)
Center, [facility]   No chief complaint on file.   HPI:      Ms. Jordan Mathis is a 34 y.o. [id number] whose LMP was No LMP recorded. (Menstrual status: Irregular Periods)., presents today for menorrhagia f/u from 7/21. Was having heavy bleeding and pelvic pain with nexplanon, Replaced 11/20. nexplanon removed and pt started on Lomedia.  ? Pelvic pain/dysparuenia  heavy bleeding and pelvic pain with nexplanon, referred by [facility]. Nexplanon REplaced 11/20. Had monthly bleeding lasting 7 days, mod flow with first one. Has had irregular bleeding since 2nd one placed, but has been having problems with 2-3 wks of heavy bleeding and clots for the past 2-3 months, changing products hourly. Has dysmen, somewhat improved with tylenol. Also having severe random, non-menstrual cramps/pain, similar to labor, not improved with tylenol.  Past Medical History:  Diagnosis Date  . GERD (gastroesophageal reflux disease)   . Pre-eclampsia     Past Surgical History:  Procedure Laterality Date  . CESAREAN SECTION      Family History  Problem Relation Age of Onset  . Hypertension Mother 67  . Colon cancer Maternal Grandmother     Social History   Socioeconomic History  . Marital status: Single    Spouse name: Not on file  . Number of children: Not on file  . Years of education: Not on file  . Highest education level: Not on file  Occupational History  . Not on file  Tobacco Use  . Smoking status: Never Smoker  . Smokeless tobacco: Never Used  Vaping Use  . Vaping Use: Never used  Substance and Sexual Activity  . Alcohol use: Yes    Comment: occasionally  . Drug use: Never  . Sexual activity: Yes    Birth control/protection: Implant  Other Topics Concern  . Not on file  Social History Narrative  . Not on file   Social Determinants of Health   Financial Resource Strain:   . Difficulty of Paying Living Expenses:   Food Insecurity:   . Worried  About Programme researcher, broadcasting/film/video in the Last Year:   . Barista in the Last Year:   Transportation Needs:   . Freight forwarder (Medical):   Marland Kitchen Lack of Transportation (Non-Medical):   Physical Activity:   . Days of Exercise per Week:   . Minutes of Exercise per Session:   Stress:   . Feeling of Stress :   Social Connections:   . Frequency of Communication with Friends and Family:   . Frequency of Social Gatherings with Friends and Family:   . Attends Religious Services:   . Active Member of Clubs or Organizations:   . Attends Banker Meetings:   Marland Kitchen Marital Status:   Intimate Partner Violence:   . Fear of Current or Ex-Partner:   . Emotionally Abused:   Marland Kitchen Physically Abused:   . Sexually Abused:     Outpatient Medications Prior to Visit  Medication Sig Dispense Refill  . etonogestrel (NEXPLANON) 68 MG IMPL implant 1 each by Subdermal route once. Nov 2020    . Norethindrone Acetate-Ethinyl Estrad-FE (MICROGESTIN 24 FE) 1-20 MG-MCG(24) tablet Take 1 tablet by mouth daily. 28 tablet 1   No facility-administered medications prior to visit.      ROS:  Review of Systems BREAST: No symptoms   OBJECTIVE:   Vitals:  There were no vitals taken for this visit.  Physical Exam  Results: No results  found for this or any previous visit (from the past 24 hour(s)).   Assessment/Plan: No diagnosis found.    No orders of the defined types were placed in this encounter.     No follow-ups on file.  Melinda Gwinner B. Amai Cappiello, PA-C 06/03/2020 10:27 AM

## 2020-08-04 ENCOUNTER — Ambulatory Visit (LOCAL_COMMUNITY_HEALTH_CENTER): Payer: Self-pay

## 2020-08-04 ENCOUNTER — Other Ambulatory Visit: Payer: Self-pay

## 2020-08-04 VITALS — BP 126/87 | Ht 63.0 in | Wt 193.0 lb

## 2020-08-04 DIAGNOSIS — Z3201 Encounter for pregnancy test, result positive: Secondary | ICD-10-CM

## 2020-08-04 LAB — PREGNANCY, URINE: Preg Test, Ur: POSITIVE — AB

## 2020-08-04 NOTE — Progress Notes (Signed)
Nexplanon removed 04/2020 and ocps initiated same day. Last ocp taken end of 05/2020 as planned pregnancy. PNV not given as client taking PNV she purchased. Unsure of prenatal care provider. Jossie Ng, RN

## 2020-08-18 NOTE — Progress Notes (Signed)
Follow-up on +PT. Patient has new OB appointment scheduled at [facility] on 09/16/2020.Marland KitchenBurt Knack, RN
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
## 2020-09-10 ENCOUNTER — Encounter: Payer: Medicaid Other | Admitting: Advanced Practice Midwife

## 2020-09-16 ENCOUNTER — Encounter: Payer: 59 | Admitting: Obstetrics & Gynecology

## 2020-11-24 NOTE — Progress Notes (Deleted)
    Center, Jordan Mathis   No chief complaint on file.   HPI:      Ms. Jordan Mathis is a 35 y.o. [id number] whose LMP was Patient's last menstrual period was 06/27/2020 (exact date)., presents today for *** Hx of pelvic pain with nexplanon in past, removed 7/21   Past Medical History:  Diagnosis Date  . GERD (gastroesophageal reflux disease)   . History of anemia   . History of C-section   . History of gestational diabetes mellitus (GDM)   . History of pre-eclampsia   . History of urinary tract infection   . Pre-eclampsia     Past Surgical History:  Procedure Laterality Date  . CESAREAN SECTION      Family History  Problem Relation Age of Onset  . Hypertension Mother 46  . Colon cancer Maternal Grandmother     Social History   Socioeconomic History  . Marital status: Single    Spouse name: Not on file  . Number of children: 2  . Years of education: 1 (currently enrolled at [employer] in Nursing Courses)  . Highest education level: High school graduate  Occupational History  . Not on file  Tobacco Use  . Smoking status: Former Smoker    Types: Cigarettes  . Smokeless tobacco: Never Used  . Tobacco comment: Quit cigarettes 4 years ago. One pack lasted client 3 - 4 days.  Vaping Use  . Vaping Use: Never used  Substance and Sexual Activity  . Alcohol use: Not Currently    Comment: Last ETOH use end of 05/2020.  . Drug use: Never  . Sexual activity: Yes    Birth control/protection: Condom    Comment: Last condom use 3 - 4 weeks ago  Other Topics Concern  . Not on file  Social History Narrative  . Not on file   Social Determinants of Health   Financial Resource Strain: Not on file  Food Insecurity: Not on file  Transportation Needs: Not on file  Physical Activity: Not on file  Stress: Not on file  Social Connections: Not on file  Intimate Partner Violence: Not At Risk  . Fear of Current or Ex-Partner: No  . Emotionally Abused:  No  . Physically Abused: No  . Sexually Abused: No    Outpatient Medications Prior to Visit  Medication Sig Dispense Refill  . etonogestrel (NEXPLANON) 68 MG IMPL implant 1 each by Subdermal route once. Nov 2020 (Patient not taking: Reported on 08/04/2020)    . Norethindrone Acetate-Ethinyl Estrad-FE (MICROGESTIN 24 FE) 1-20 MG-MCG(24) tablet Take 1 tablet by mouth daily. (Patient not taking: Reported on 08/04/2020) 28 tablet 1  . Prenatal Vit-Fe Fumarate-FA (MULTIVITAMIN-PRENATAL) 27-0.8 MG TABS tablet Take 1 tablet by mouth daily at 12 noon.     No facility-administered medications prior to visit.      ROS:  Review of Systems BREAST: No symptoms   OBJECTIVE:   Vitals:  LMP 06/27/2020 (Exact Date)   Physical Exam  Results: No results found for this or any previous visit (from the past 24 hour(s)).   Assessment/Plan: No diagnosis found.    No orders of the defined types were placed in this encounter.     No follow-ups on file.  Malayah Mathis B. Jordan Disney, PA-C 11/24/2020 4:35 PM

## 2020-11-25 ENCOUNTER — Ambulatory Visit: Payer: Medicaid Other | Admitting: Obstetrics and Gynecology

## 2021-03-18 DIAGNOSIS — A749 Chlamydial infection, unspecified: Secondary | ICD-10-CM

## 2021-03-18 HISTORY — DX: Chlamydial infection, unspecified: A74.9

## 2021-03-23 DIAGNOSIS — O09891 Supervision of other high risk pregnancies, first trimester: Secondary | ICD-10-CM | POA: Insufficient documentation

## 2021-03-23 DIAGNOSIS — O0990 Supervision of high risk pregnancy, unspecified, unspecified trimester: Secondary | ICD-10-CM | POA: Insufficient documentation

## 2021-03-27 ENCOUNTER — Encounter: Payer: Medicaid Other | Admitting: Obstetrics and Gynecology

## 2021-03-30 ENCOUNTER — Encounter: Payer: Medicaid Other | Admitting: Obstetrics

## 2021-04-22 ENCOUNTER — Encounter: Payer: Self-pay | Admitting: Obstetrics and Gynecology

## 2021-05-14 ENCOUNTER — Other Ambulatory Visit: Payer: Self-pay

## 2021-05-14 ENCOUNTER — Ambulatory Visit (INDEPENDENT_AMBULATORY_CARE_PROVIDER_SITE_OTHER): Payer: PRIVATE HEALTH INSURANCE | Admitting: Obstetrics and Gynecology

## 2021-05-14 ENCOUNTER — Encounter: Payer: Self-pay | Admitting: Obstetrics and Gynecology

## 2021-05-14 VITALS — BP 114/78 | HR 86 | Ht 63.0 in | Wt 194.4 lb

## 2021-05-14 DIAGNOSIS — Z8632 Personal history of gestational diabetes: Secondary | ICD-10-CM

## 2021-05-14 DIAGNOSIS — Z3482 Encounter for supervision of other normal pregnancy, second trimester: Secondary | ICD-10-CM

## 2021-05-14 DIAGNOSIS — Z3A15 15 weeks gestation of pregnancy: Secondary | ICD-10-CM

## 2021-05-14 DIAGNOSIS — Z1379 Encounter for other screening for genetic and chromosomal anomalies: Secondary | ICD-10-CM | POA: Diagnosis not present

## 2021-05-14 DIAGNOSIS — R638 Other symptoms and signs concerning food and fluid intake: Secondary | ICD-10-CM

## 2021-05-14 DIAGNOSIS — Z8759 Personal history of other complications of pregnancy, childbirth and the puerperium: Secondary | ICD-10-CM | POA: Diagnosis not present

## 2021-05-14 DIAGNOSIS — O34219 Maternal care for unspecified type scar from previous cesarean delivery: Secondary | ICD-10-CM

## 2021-05-14 DIAGNOSIS — Z3689 Encounter for other specified antenatal screening: Secondary | ICD-10-CM

## 2021-05-14 DIAGNOSIS — Z113 Encounter for screening for infections with a predominantly sexual mode of transmission: Secondary | ICD-10-CM

## 2021-05-14 LAB — POCT URINALYSIS DIPSTICK OB
Bilirubin, UA: NEGATIVE
Blood, UA: NEGATIVE
Glucose, UA: NEGATIVE
Ketones, UA: NEGATIVE
Leukocytes, UA: NEGATIVE
Nitrite, UA: NEGATIVE
Spec Grav, UA: 1.015 (ref 1.010–1.025)
Urobilinogen, UA: 0.2 E.U./dL
pH, UA: 7 (ref 5.0–8.0)

## 2021-05-14 NOTE — Progress Notes (Signed)
OB-Pt present for routine prenatal care. Pt stated fetal movement present; contractions not present; no vaginal bleeding and no changes in vaginal discharge.  Pt c/o pain/pressure in c-section area and daily headaches not relieved by Tylenol.   Patient has hx of preeclampsia and gestational diabetes in prior pregnancies.

## 2021-05-14 NOTE — Patient Instructions (Signed)

## 2021-05-14 NOTE — Progress Notes (Deleted)
OB History  Gravida Para Term Preterm AB Living  4 2 2  0 1 2  SAB IAB Ectopic Multiple Live Births  0 1 0 0 2    # Outcome Date GA Lbr Len/2nd Weight Sex Delivery Anes PTL Lv  4 Current           3 Term 10/02/14   8 lb 10 oz (3.912 kg) F CS-Unspec   LIV  2 Term 06/24/12   7 lb 13 oz (3.544 kg) F Vag-Spont Spinal, EPI  LIV  1 IAB

## 2021-05-14 NOTE — Progress Notes (Addendum)
TRANSFER IN OB HISTORY AND PHYSICAL  SUBJECTIVE:       Jordan Mathis is a 35 y.o. [id number] female, Patient's last menstrual period was 01/25/2021 (exact date)., Estimated Date of Delivery: 11/01/21, [redacted]w[redacted]d presents today for [facility] from outside records is accomplished today. Complaints today include: None.  Reports recently being treated for Chlamydia infection. Partner treated, but tested negative. Plans to breastfeed.     Gynecologic History Patient's last menstrual period was 01/25/2021 (exact date). Normal Contraception: none Last Pap: up to date per patient. Results were: normal   Obstetric History OB History  Gravida Para Term Preterm AB Living  _0 0 1 2  SAB IAB Ectopic Multiple Live Births  0 1 0   2    # Outcome Date GA Lbr Len/2nd Weight Sex Delivery Anes PTL Lv  4 Current           3 Term 10/02/14 [id number] lb 10 oz (3.912 kg) F CS-Unspec   LIV     Birth Comments: [facility], C-section due to infant size     Complications: Gestational diabetes mellitus  2 Term 06/24/12 403w0d lb 6 oz (3.345 kg) F Vag-Spont   LIV     Birth Comments: [facility]     Complications: Pre-eclampsia, third trimester  1 IAB             Past Medical History:  Diagnosis Date   Chlamydia 03/2021   GERD (gastroesophageal reflux disease)    History of anemia    History of C-section    History of gestational diabetes mellitus (GDM)    History of pre-eclampsia    History of urinary tract infection     Past Surgical History:  Procedure Laterality Date   CESAREAN SECTION  10/02/2014    Current Outpatient Medications on File Prior to Visit  Medication Sig Dispense Refill   Prenatal Vit-Fe Fumarate-FA (MULTIVITAMIN-PRENATAL) 27-0.8 MG TABS tablet Take 1 tablet by mouth daily at 12 noon.     No current facility-administered medications on file prior to visit.    Allergies  Allergen Reactions   Amoxicillin Hives    Penicillins Hives   Penicillins Hives    Social History   Socioeconomic History   Marital status: Unknown    Spouse name: Not on file   Number of children: 2   Years of education: 13 36urrently enrolled [facility] [employer] [facility]   Highest education level: High school graduate  Occupational History   Not on file  Tobacco Use   Smoking status: Never   Smokeless tobacco: Never  Vaping Use   Vaping Use: Never used  Substance and Sexual Activity   Alcohol use: Never    Comment: Last ETOH use end of 05/2020.   Drug use: Never   Sexual activity: Yes    Birth control/protection: Condom    Comment: Last condom use 3 - 4 weeks ago  Other Topics Concern   Not on file  Social History Narrative   ** Merged History Encounter **       Social Determinants of Health   Financial Resource Strain: Not on file  Food Insecurity: Not on file  Transportation Needs: Not on file  Physical Activity: Not on file  Stress: Not on file  Social Connections: Not on file  Intimate Partner Violence: Not At Risk   Fear of Current or Ex-Partner: No   Emotionally Abused: No  Physically Abused: No   Sexually Abused: No    Family History  Problem Relation Age of Onset   Hypertension Mother 11   Hypertension Father    Colon cancer Maternal Grandmother     The following portions of the patient's history were reviewed and updated as appropriate: allergies, current medications, past OB history, past medical history, past surgical history, past family history, past social history, and problem list.    OBJECTIVE: Initial Physical Exam (New OB) Blood pressure 114/78, pulse 86, height _0  (1.6 m), weight 194 lb 6.4 oz (88.2 kg), last menstrual period 01/25/2021, unknown if currently breastfeeding.  Body mass index is 34.44 kg/m.   GENERAL APPEARANCE: alert, well appearing, in no apparent distress HEAD: normocephalic, atraumatic THYROID: no thyromegaly or masses present BREASTS: not  examined LUNGS: clear to auscultation, no wheezes, rales or rhonchi, symmetric air entry HEART: regular rate and rhythm, no murmurs ABDOMEN: soft, nontender, nondistended, no abnormal masses, no epigastric pain.  FHT 150 bpm.  EXTREMITIES: no redness or tenderness in the calves or thighs, no edema SKIN: normal coloration and turgor, no rashes NEUROLOGIC: alert, oriented, normal speech, no focal findings or movement disorder noted  PELVIC EXAM Deferred  ASSESSMENT: 1. Encounter for supervision of other normal pregnancy in second trimester   2. [redacted] weeks gestation of pregnancy   3. Genetic screening   4. History of pre-eclampsia   5. Encounter for fetal anatomic survey   6. Screening examination for STD (sexually transmitted disease)   7. History of gestational diabetes   8. Increased BMI   9. Desires VBAC (vaginal birth after cesarean) trial     PLAN: 1. Encounter for supervision of other normal pregnancy in second trimester New OB counseling: - The patient has been given an overview regarding routine prenatal care. - Recommendations regarding diet, weight gain, and exercise in pregnancy were given. - Prenatal testing, optional genetic testing, and ultrasound use in pregnancy were reviewed.  - Benefits of Breast Feeding were discussed. The patient is encouraged to consider nursing her baby post partum. - The patient has Medicaid (secondary insurance).  [facility] Medicaid Risk Screening Form completed today   2. Genetic screening - Normal NIPT testing (MaterniT21) - AFP, Serum, Open Spina Bifida ordered toda  4. History of pre-eclampsia - Comp Met (CMET) - Protein / creatinine ratio, urine  - To begin daily baby aspirin  5. Encounter for fetal anatomic survey  - US OB Comp + 14 Wk; Future  6. Screening examination for STD (sexually transmitted disease)  - GC/Chlamydia Probe Amp for TOC for Chlamydia diagnosed early in pregnancy.  7. History of gestational diabetes - Normal  HgbA1c, for glucola in third trimester.  8. Increased BMI - See above #1. To monitor weight in pregnancy.   9.  Desires VBAC trial - Briefly discussed event surrounding her C-section.  Patient notes that she was allowed to labor however got to approximately 6 cm when her providers decided that she may need a C-section due to suspected CPD as they were anticipating a larger fetus.  Infant was approximately 1 pound bigger than her last baby.  She does report that she desires a trial of labor.  Discussed that as long as she is able to manage fetal size with healthy diet and if she happens to develop gestational diabetes to manage tight control of blood sugars it is possible that she may not have to encounter the same issue with this pregnancy.  Counseled regarding TOLAC vs RCS;  risks/benefits discussed in detail. All questions answered.  VBAC Calculated score 73.3%.   Patient overall a good candidate.    RTC in 4 weeks for next OB visit.    Rubie Maid, MD [facility]

## 2021-05-15 LAB — PROTEIN / CREATININE RATIO, URINE
Creatinine, Urine: 173.9 mg/dL
Protein, Ur: 20.1 mg/dL
Protein/Creat Ratio: 116 mg/g creat (ref 0–200)

## 2021-05-17 LAB — GC/CHLAMYDIA PROBE AMP
Chlamydia trachomatis, NAA: NEGATIVE
Neisseria Gonorrhoeae by PCR: NEGATIVE

## 2021-05-22 LAB — AFP, SERUM, OPEN SPINA BIFIDA
AFP MoM: 1.22
AFP Value: 33.2 ng/mL
Gest. Age on Collection Date: 15.4 weeks
Maternal Age At EDD: 33.8 yr
OSBR Risk 1 IN: 6110
Test Results:: NEGATIVE
Weight: 194 [lb_av]

## 2021-05-24 LAB — COMPREHENSIVE METABOLIC PANEL
ALT: 7 IU/L (ref 0–32)
AST: 11 IU/L (ref 0–40)
Albumin/Globulin Ratio: 1.3 (ref 1.2–2.2)
Albumin: 3.9 g/dL (ref 3.8–4.8)
Alkaline Phosphatase: 53 IU/L (ref 44–121)
BUN/Creatinine Ratio: 8 — ABNORMAL LOW (ref 9–23)
BUN: 4 mg/dL — ABNORMAL LOW (ref 6–20)
Bilirubin Total: <0.2 mg/dL (ref 0.0–1.2)
CO2: 22 mmol/L (ref 20–29)
Calcium: 8.6 mg/dL — ABNORMAL LOW (ref 8.7–10.2)
Chloride: 103 mmol/L (ref 96–106)
Creatinine, Ser: 0.49 mg/dL — ABNORMAL LOW (ref 0.57–1.00)
Globulin, Total: 2.9 g/dL (ref 1.5–4.5)
Glucose: 77 mg/dL (ref 65–99)
Potassium: 3.9 mmol/L (ref 3.5–5.2)
Sodium: 138 mmol/L (ref 134–144)
Total Protein: 6.8 g/dL (ref 6.0–8.5)
eGFR: 128 mL/min/{1.73_m2} (ref >59)

## 2021-05-24 LAB — SPECIMEN STATUS REPORT

## 2021-06-11 ENCOUNTER — Other Ambulatory Visit: Payer: Self-pay

## 2021-06-11 ENCOUNTER — Ambulatory Visit (INDEPENDENT_AMBULATORY_CARE_PROVIDER_SITE_OTHER): Payer: PRIVATE HEALTH INSURANCE | Admitting: Obstetrics and Gynecology

## 2021-06-11 VITALS — BP 118/79 | HR 89 | Wt 195.4 lb

## 2021-06-11 DIAGNOSIS — Z3482 Encounter for supervision of other normal pregnancy, second trimester: Secondary | ICD-10-CM

## 2021-06-11 DIAGNOSIS — Z3A19 19 weeks gestation of pregnancy: Secondary | ICD-10-CM

## 2021-06-11 LAB — POCT URINALYSIS DIPSTICK OB
Blood, UA: NEGATIVE
Glucose, UA: NEGATIVE
Leukocytes, UA: NEGATIVE
Nitrite, UA: NEGATIVE
Spec Grav, UA: 1.025 (ref 1.010–1.025)
Urobilinogen, UA: 0.2 E.U./dL
pH, UA: 6.5 (ref 5.0–8.0)

## 2021-06-11 NOTE — Progress Notes (Signed)
ROB: States that her nausea and vomiting has gotten worse.  She vomits "almost every day."  Nausea and vomiting of pregnancy discussed-literature given.  Over-the-counter remedies discussed.  Patient scheduled for her anatomy scan next week.

## 2021-06-19 ENCOUNTER — Other Ambulatory Visit: Payer: Self-pay

## 2021-06-19 ENCOUNTER — Ambulatory Visit
Admission: RE | Admit: 2021-06-19 | Discharge: 2021-06-19 | Disposition: A | Discharge status: Home / Self Care | Payer: Medicaid Other | Source: Ambulatory Visit | Attending: Obstetrics and Gynecology | Admitting: Obstetrics and Gynecology

## 2021-06-19 DIAGNOSIS — Z3689 Encounter for other specified antenatal screening: Secondary | ICD-10-CM | POA: Diagnosis not present

## 2021-07-08 ENCOUNTER — Ambulatory Visit (INDEPENDENT_AMBULATORY_CARE_PROVIDER_SITE_OTHER): Payer: Medicaid Other | Admitting: Obstetrics and Gynecology

## 2021-07-08 ENCOUNTER — Encounter: Payer: Self-pay | Admitting: Obstetrics and Gynecology

## 2021-07-08 ENCOUNTER — Other Ambulatory Visit: Payer: Self-pay

## 2021-07-08 VITALS — BP 115/81 | HR 84 | Wt 195.2 lb

## 2021-07-08 DIAGNOSIS — O34219 Maternal care for unspecified type scar from previous cesarean delivery: Secondary | ICD-10-CM

## 2021-07-08 DIAGNOSIS — N9489 Other specified conditions associated with female genital organs and menstrual cycle: Secondary | ICD-10-CM

## 2021-07-08 DIAGNOSIS — O0990 Supervision of high risk pregnancy, unspecified, unspecified trimester: Secondary | ICD-10-CM | POA: Insufficient documentation

## 2021-07-08 DIAGNOSIS — O0992 Supervision of high risk pregnancy, unspecified, second trimester: Secondary | ICD-10-CM | POA: Insufficient documentation

## 2021-07-08 DIAGNOSIS — Z3A00 Weeks of gestation of pregnancy not specified: Secondary | ICD-10-CM

## 2021-07-08 DIAGNOSIS — O09529 Supervision of elderly multigravida, unspecified trimester: Secondary | ICD-10-CM | POA: Insufficient documentation

## 2021-07-08 DIAGNOSIS — O26892 Other specified pregnancy related conditions, second trimester: Secondary | ICD-10-CM

## 2021-07-08 DIAGNOSIS — R102 Pelvic and perineal pain: Secondary | ICD-10-CM

## 2021-07-08 DIAGNOSIS — Z8759 Personal history of other complications of pregnancy, childbirth and the puerperium: Secondary | ICD-10-CM | POA: Insufficient documentation

## 2021-07-08 DIAGNOSIS — Z8632 Personal history of gestational diabetes: Secondary | ICD-10-CM | POA: Insufficient documentation

## 2021-07-08 DIAGNOSIS — Z3402 Encounter for supervision of normal first pregnancy, second trimester: Secondary | ICD-10-CM

## 2021-07-08 DIAGNOSIS — O283 Abnormal ultrasonic finding on antenatal screening of mother: Secondary | ICD-10-CM | POA: Insufficient documentation

## 2021-07-08 DIAGNOSIS — O26899 Other specified pregnancy related conditions, unspecified trimester: Secondary | ICD-10-CM

## 2021-07-08 DIAGNOSIS — Z3A23 23 weeks gestation of pregnancy: Secondary | ICD-10-CM

## 2021-07-08 DIAGNOSIS — Z2821 Immunization not carried out because of patient refusal: Secondary | ICD-10-CM | POA: Insufficient documentation

## 2021-07-08 DIAGNOSIS — O09299 Supervision of pregnancy with other poor reproductive or obstetric history, unspecified trimester: Secondary | ICD-10-CM | POA: Insufficient documentation

## 2021-07-08 LAB — POCT URINALYSIS DIPSTICK OB
Bilirubin, UA: NEGATIVE
Blood, UA: NEGATIVE
Glucose, UA: NEGATIVE
Ketones, UA: NEGATIVE
Leukocytes, UA: NEGATIVE
Nitrite, UA: NEGATIVE
Spec Grav, UA: 1.025 (ref 1.010–1.025)
Urobilinogen, UA: 0.2 E.U./dL
pH, UA: 6 (ref 5.0–8.0)

## 2021-07-08 NOTE — Progress Notes (Signed)
ROB: She is doing well. Has no concerns.

## 2021-07-08 NOTE — Progress Notes (Addendum)
ROB: Patient complains of pelvic pressure. Advised on pregnancy girdle. Normal AFP.  Discussed further desires for TOLAC and h/o macrosomia.  Can check growth at 36-37 weeks and make final decision. Reviewed anatomy scan, EIF present. Discussed findings, reassured patient that this will likely resolve by birth. Declined flu vaccine.  RTC in 4 weeks, for 28 week labs then.

## 2021-08-05 ENCOUNTER — Other Ambulatory Visit: Payer: Medicaid Other

## 2021-08-05 ENCOUNTER — Other Ambulatory Visit: Payer: Self-pay

## 2021-08-05 ENCOUNTER — Encounter: Payer: Self-pay | Admitting: Obstetrics and Gynecology

## 2021-08-05 ENCOUNTER — Ambulatory Visit (INDEPENDENT_AMBULATORY_CARE_PROVIDER_SITE_OTHER): Payer: Medicaid Other | Admitting: Obstetrics and Gynecology

## 2021-08-05 VITALS — BP 120/84 | HR 88 | Wt 197.9 lb

## 2021-08-05 DIAGNOSIS — Z3A27 27 weeks gestation of pregnancy: Secondary | ICD-10-CM

## 2021-08-05 DIAGNOSIS — Z23 Encounter for immunization: Secondary | ICD-10-CM

## 2021-08-05 DIAGNOSIS — Z3482 Encounter for supervision of other normal pregnancy, second trimester: Secondary | ICD-10-CM

## 2021-08-05 DIAGNOSIS — O0992 Supervision of high risk pregnancy, unspecified, second trimester: Secondary | ICD-10-CM

## 2021-08-05 LAB — POCT URINALYSIS DIPSTICK OB
Bilirubin, UA: NEGATIVE
Blood, UA: NEGATIVE
Glucose, UA: NEGATIVE
Ketones, UA: NEGATIVE
Nitrite, UA: NEGATIVE
Spec Grav, UA: 1.01 (ref 1.010–1.025)
Urobilinogen, UA: 0.2 E.U./dL
pH, UA: 7 (ref 5.0–8.0)

## 2021-08-05 NOTE — Progress Notes (Signed)
ROB: She is doing well, no concerns. 1 hr. Glucose, BTC form signed, TDAP  Declined.

## 2021-08-05 NOTE — Progress Notes (Signed)
ROB: No complaints.  1 hour GCT today.  Declines Tdap.  Has pelvic pressure.  Patient says she has "probably changed her mind and now wants to have a repeat C-section".  I informed her she still had time to decide.

## 2021-08-06 LAB — GLUCOSE, 1 HOUR GESTATIONAL: Gestational Diabetes Screen: 168 mg/dL — ABNORMAL HIGH (ref 70–139)

## 2021-08-06 LAB — RPR: RPR Ser Ql: NONREACTIVE

## 2021-08-06 LAB — CBC
Hematocrit: 33.1 % — ABNORMAL LOW (ref 34.0–46.6)
Hemoglobin: 10.5 g/dL — ABNORMAL LOW (ref 11.1–15.9)
MCH: 26.6 pg (ref 26.6–33.0)
MCHC: 31.7 g/dL (ref 31.5–35.7)
MCV: 84 fL (ref 79–97)
Platelets: 288 10*3/uL (ref 150–450)
RBC: 3.95 x10E6/uL (ref 3.77–5.28)
RDW: 13.3 % (ref 11.7–15.4)
WBC: 6.2 10*3/uL (ref 3.4–10.8)

## 2021-08-06 LAB — HEPATITIS C ANTIBODY: Hep C Virus Ab: <0.1 s/co ratio (ref 0.0–0.9)

## 2021-08-10 ENCOUNTER — Other Ambulatory Visit: Payer: Self-pay

## 2021-08-10 DIAGNOSIS — Z3A28 28 weeks gestation of pregnancy: Secondary | ICD-10-CM

## 2021-08-10 DIAGNOSIS — Z3483 Encounter for supervision of other normal pregnancy, third trimester: Secondary | ICD-10-CM

## 2021-08-12 ENCOUNTER — Other Ambulatory Visit: Payer: Medicaid Other

## 2021-08-12 ENCOUNTER — Other Ambulatory Visit: Payer: Self-pay

## 2021-08-12 DIAGNOSIS — Z3483 Encounter for supervision of other normal pregnancy, third trimester: Secondary | ICD-10-CM

## 2021-08-12 DIAGNOSIS — Z3A28 28 weeks gestation of pregnancy: Secondary | ICD-10-CM

## 2021-08-13 LAB — GESTATIONAL GLUCOSE TOLERANCE
Glucose, Fasting: 100 mg/dL — ABNORMAL HIGH (ref 70–94)
Glucose, GTT - 1 Hour: 176 mg/dL (ref 70–179)
Glucose, GTT - 2 Hour: 169 mg/dL — ABNORMAL HIGH (ref 70–154)
Glucose, GTT - 3 Hour: 121 mg/dL (ref 70–139)

## 2021-08-14 ENCOUNTER — Other Ambulatory Visit: Payer: Self-pay

## 2021-08-14 DIAGNOSIS — Z3A28 28 weeks gestation of pregnancy: Secondary | ICD-10-CM

## 2021-08-14 DIAGNOSIS — O24419 Gestational diabetes mellitus in pregnancy, unspecified control: Secondary | ICD-10-CM

## 2021-08-21 NOTE — Patient Instructions (Signed)
Breastfeeding and Breast Care It is normal to have some problems when you start to breastfeed your new baby. But there are things that you can do to take care of yourself and help prevent problems. This includes keeping your breasts healthy and making sure that your baby's mouth attaches (latches) properly to your nipple for feedings. Work with your doctor or breastfeeding specialist to find what works best for you. How does self-care benefit me? If you keep your breasts healthy and you let your baby attach to your nipples in the right way, you will avoid these problems: Cracked or sore nipples. Breasts becoming overfilled with milk. Plugged milk ducts. Low milk supply. Breast swelling or infection. How does self-care benefit my baby? By preventing problems with your breasts, you will ensure that your baby will feed well and will gain the right amount of weight. What actions can I take to care for myself during breastfeeding? Best ways to breastfeed Always make sure that your baby latches properly to breastfeed. Make sure that your baby is in a proper position. Try different breastfeeding positions to find one that works best for you and your baby. Breastfeed when you feel like you need to make your breasts less full or when your baby shows signs of hunger. This is called "breastfeeding on demand." Do not delay feedings. Try to relax when it is time to feed your baby. This helps your body release milk from your breast. To help increase milk flow, do these things before feeding: Remove a small amount of milk from your breast. Use a pump or squeeze with your hand. Apply warm, moist heat to your breast. Do this in the shower or use hand towels soaked with warm water. Massage your breasts. Do this when you are breastfeeding as well. Caring for your breasts   To help your breasts stay healthy and keep them from getting too dry: Avoid using soap on your nipples. Let your nipples air-dry for 3-4  minutes after each feeding. Do not use things like a hair dryer to dry your breasts. This can make the skin dry and will cause irritation and pain. Use only cotton bra pads to soak up breast milk that leaks. Change the pads if they become soaked with milk. If you use bra pads that can be thrown away, change them often. Put some lanolin on your nipples after breastfeeding. Pure lanolin does not need to be washed off your nipple before you feed your baby again. Pure lanolin is not harmful to your baby. Rub some breast milk into your nipples: Use your hand to squeeze out a few drops of breast milk. Gently massage the milk into your nipples. Let your nipples air-dry. Wear a supportive nursing bra. Avoid wearing: Tight clothing. Underwire bras or bras that put pressure on your breasts. Use ice to help relieve pain or swelling of your breasts: Put ice in a plastic bag. Place a towel between your skin and the bag. Leave the ice on for 20 minutes, 2-3 times a day. Follow these instructions at home: Drink enough fluid to keep your pee (urine) pale yellow. Get plenty of rest. Sleep when your baby sleeps. Talk to your doctor or breastfeeding specialist before taking any herbal supplements. Eat a balanced diet. This includes fruits, vegetables, whole grains, lean proteins, and dairy or dairy alternatives Contact a health care provider if: You have nipple pain. You have cracking or soreness in your nipples that lasts longer than 1 week. Your breasts are  overfilled with milk, and this lasts longer than 48 hours. You have a fever. You have pus-like fluid coming from your nipple. You have redness, a rash, swelling, itching, or burning on your breast. Your baby does not gain weight. Your baby loses weight. Your baby is not feeding regularly or is very sleepy and lacks energy. Summary There are things that you can do to take care of yourself and help prevent many common breastfeeding problems. Always  make sure that your baby's mouth attaches (latches) to your nipple properly to breastfeed. Keep your nipples from getting too dry, drink plenty of fluid, and get plenty of rest. Feed on demand. Do not delay feedings. This information is not intended to replace advice given to you by your health care provider. Make sure you discuss any questions you have with your health care provider. Document Revised: 03/25/2020 Document Reviewed: 03/25/2020 Elsevier Patient Education  2022 [employer]    Common Medications Safe in Pregnancy  Acne:      Constipation:  Benzoyl Peroxide     Colace  Clindamycin      Dulcolax Suppository  Topica Erythromycin     Fibercon  Salicylic Acid      Metamucil         Miralax AVOID:        Senakot   Accutane    Cough:  Retin-A       Cough Drops  Tetracycline      Phenergan w/ Codeine if Rx  Minocycline      Robitussin (Plain & DM)  Antibiotics:     Crabs/Lice:  Ceclor       RID  Cephalosporins    AVOID:  E-Mycins      Kwell  Keflex  Macrobid/Macrodantin   Diarrhea:  Penicillin      Kao-Pectate  Zithromax      Imodium AD         PUSH FLUIDS AVOID:       Cipro     Fever:  Tetracycline      Tylenol (Regular or Extra  Minocycline       Strength)  Levaquin      Extra Strength-Do not          Exceed 8 tabs/24 hrs Caffeine:        <200mg/day (equiv. To 1 cup of coffee or  approx. 3 12 oz sodas)         Gas: Cold/Hayfever:       Gas-X  Benadryl      Mylicon  Claritin       Phazyme  **Claritin-D        Chlor-Trimeton    Headaches:  Dimetapp      ASA-Free Excedrin  Drixoral-Non-Drowsy     Cold Compress  Mucinex (Guaifenasin)     Tylenol (Regular or Extra  Sudafed/Sudafed-12 Hour     Strength)  **Sudafed PE Pseudoephedrine   Tylenol Cold & Sinus     Vicks Vapor Rub  Zyrtec  **AVOID if Problems With Blood Pressure         Heartburn: Avoid lying down for at least 1 hour after meals  Aciphex      Maalox     Rash:  Milk of  Magnesia     Benadryl    Mylanta       1% Hydrocortisone Cream  Pepcid  Pepcid Complete   Sleep Aids:  Prevacid      Ambien   Prilosec       Benadryl    Chamomile Tea  Tums (Limit 4/day)     Unisom         Tylenol PM         Warm milk-add vanilla or  Hemorrhoids:       Sugar for taste  Anusol/Anusol H.C.  (RX: Analapram 2.5%)  Sugar Substitutes:  Hydrocortisone OTC     Ok in moderation  Preparation H      Tucks        Vaseline lotion applied to tissue with wiping    Herpes:     Throat:  Acyclovir      Oragel  Famvir  Valtrex     Vaccines:         Flu Shot Leg Cramps:       *Gardasil  Benadryl      Hepatitis A         Hepatitis B Nasal Spray:       Pneumovax  Saline Nasal Spray     Polio Booster         Tetanus Nausea:       Tuberculosis test or PPD  Vitamin B6 25 mg TID   AVOID:    Dramamine      *Gardasil  Emetrol       Live Poliovirus  Ginger Root 250 mg QID    MMR (measles, mumps &  High Complex Carbs @ Bedtime    rebella)  Sea Bands-Accupressure    Varicella (Chickenpox)  Unisom 1/2 tab TID     *No known complications           If received before Pain:         Known pregnancy;   Darvocet       Resume series after  Lortab        Delivery  Percocet    Yeast:   Tramadol      Femstat  Tylenol 3      Gyne-lotrimin  Ultram       Monistat  Vicodin           MISC:         All Sunscreens           Hair Coloring/highlights          Insect Repellant's          (Including DEET)         Mystic Tans

## 2021-08-24 ENCOUNTER — Other Ambulatory Visit: Payer: Self-pay

## 2021-08-24 ENCOUNTER — Encounter: Payer: Medicaid Other | Attending: Obstetrics and Gynecology | Admitting: *Deleted

## 2021-08-24 ENCOUNTER — Encounter: Payer: Self-pay | Admitting: Obstetrics and Gynecology

## 2021-08-24 ENCOUNTER — Encounter: Payer: Self-pay | Admitting: *Deleted

## 2021-08-24 ENCOUNTER — Ambulatory Visit (INDEPENDENT_AMBULATORY_CARE_PROVIDER_SITE_OTHER): Payer: Medicaid Other | Admitting: Obstetrics and Gynecology

## 2021-08-24 VITALS — BP 120/66 | HR 91 | Wt 199.4 lb

## 2021-08-24 VITALS — BP 102/78 | Ht 63.0 in | Wt 196.2 lb

## 2021-08-24 DIAGNOSIS — O479 False labor, unspecified: Secondary | ICD-10-CM

## 2021-08-24 DIAGNOSIS — Z8632 Personal history of gestational diabetes: Secondary | ICD-10-CM

## 2021-08-24 DIAGNOSIS — O24410 Gestational diabetes mellitus in pregnancy, diet controlled: Secondary | ICD-10-CM

## 2021-08-24 DIAGNOSIS — O34219 Maternal care for unspecified type scar from previous cesarean delivery: Secondary | ICD-10-CM

## 2021-08-24 DIAGNOSIS — Z3A28 28 weeks gestation of pregnancy: Secondary | ICD-10-CM | POA: Insufficient documentation

## 2021-08-24 DIAGNOSIS — O24419 Gestational diabetes mellitus in pregnancy, unspecified control: Secondary | ICD-10-CM | POA: Diagnosis present

## 2021-08-24 DIAGNOSIS — Z3A30 30 weeks gestation of pregnancy: Secondary | ICD-10-CM

## 2021-08-24 DIAGNOSIS — Z3483 Encounter for supervision of other normal pregnancy, third trimester: Secondary | ICD-10-CM

## 2021-08-24 DIAGNOSIS — O09299 Supervision of pregnancy with other poor reproductive or obstetric history, unspecified trimester: Secondary | ICD-10-CM

## 2021-08-24 LAB — POCT URINALYSIS DIPSTICK OB
Bilirubin, UA: NEGATIVE
Blood, UA: NEGATIVE
Glucose, UA: NEGATIVE
Ketones, UA: NEGATIVE
Nitrite, UA: NEGATIVE
POC,PROTEIN,UA: NEGATIVE
Spec Grav, UA: <=1.005 — AB (ref 1.010–1.025)
Urobilinogen, UA: 0.2 E.U./dL
pH, UA: 8 (ref 5.0–8.0)

## 2021-08-24 MED ORDER — PRENATAL 27-0.8 MG PO TABS: 1.0000 | ORAL_TABLET | Freq: Every day | ORAL | 11 refills | Status: AC

## 2021-08-24 NOTE — Patient Instructions (Signed)
Read booklet on Gestational Diabetes Follow Gestational Meal Planning Guidelines Allow 2-3 hours between meals and snacks Limit fruit at bedtime Add 1 protein serving when eating fruit for a snack Complete a 3 Day Food Record and bring to next appointment Check blood sugars 4 x day - before breakfast and 2 hrs after every meal and record  Bring blood sugar log to all appointments Call MD for prescription for meter strips and lancets Strips   Accu-Chek Guide  Lancets   Accu-Chek Softclix Purchase urine ketone strips if instructed by MD and check urine ketones every am:  If + increase bedtime snack to 1 protein and 2 carbohydrate servings Walk 20-30 minutes at least 5 x week if permitted by MD

## 2021-08-24 NOTE — Progress Notes (Signed)
OB-Pt present for routine prenatal care. Pt stated fetal movement present; braxton hick contractions present; no vaginal bleeding and watery/thick vaginal discharge.     Pt c/o increase in vaginal discharge more watery and thick along with pelvic pain and pressure.

## 2021-08-24 NOTE — Progress Notes (Signed)
Diabetes Self-Management Education  Visit Type: First/Initial  Appt. Start Time: 1320 Appt. End Time: 1425  08/24/2021  Jordan Mathis, identified by name and date of birth, is a 35 y.o. female with a diagnosis of Diabetes: Gestational Diabetes.   ASSESSMENT  Blood pressure 102/78, height 5\' 3"  (1.6 m), weight 196 lb 3.2 oz (89 kg), last menstrual period 01/25/2021, estimated date of delivery 11/01/2021 Body mass index is 34.76 kg/m.   Diabetes Self-Management Education - 08/24/21 1511       Visit Information   Visit Type First/Initial      Initial Visit   Diabetes Type Gestational Diabetes    Are you currently following a meal plan? No    Are you taking your medications as prescribed? Yes    Date Diagnosed 2 weeks ago      Health Coping   How would you rate your overall health? Fair      Psychosocial Assessment   Patient Belief/Attitude about Diabetes Other (comment)   "no emotions"   Self-care barriers None    Self-management support Doctor's office    Patient Concerns Nutrition/Meal planning;Healthy Lifestyle    Special Needs None    Preferred Learning Style Visual;Other (comment)   talking/discussion   Learning Readiness Ready    How often do you need to have someone help you when you read instructions, pamphlets, or other written materials from your doctor or pharmacy? 1 - Never    What is the last grade level you completed in school? nursing      Pre-Education Assessment   Patient understands the diabetes disease and treatment process. Needs Review    Patient understands incorporating nutritional management into lifestyle. Needs Instruction    Patient undertands incorporating physical activity into lifestyle. Needs Review    Patient understands using medications safely. Needs Instruction    Patient understands monitoring blood glucose, interpreting and using results Needs Review    Patient understands prevention, detection, and treatment of acute  complications. Needs Review    Patient understands prevention, detection, and treatment of chronic complications. Needs Review    Patient understands how to develop strategies to address psychosocial issues. Needs Review    Patient understands how to develop strategies to promote health/change behavior. Needs Review      Complications   Last HgB A1C per patient/outside source 5.5 %   04/08/2021   How often do you check your blood sugar? 0 times/day (not testing)   Provided Accu-Chek Guide Me meter and instructed on use. BG upon return demonstration was 86 mg/dL at 04/10/2021 pm - 2 1/2 hrs pp.   Have you had a dilated eye exam in the past 12 months? No    Have you had a dental exam in the past 12 months? No    Are you checking your feet? No      Dietary Intake   Breakfast eggs, toast with apple butter    Snack (morning) reports 4-5 snacks/day - rice krispies, apple, mandarin oranges - 4, chips, chex mix, nuts, popcorn    Lunch left overs    Dinner chicken, beef, fish; occasional potatoes, peas, beans, corn, rice, pasta, green beans, greens, Caesar salads or salads with egg, tomato, cheese    Beverage(s) water, 1 cup coffee      Exercise   Exercise Type Light (walking / raking leaves)    How many days per week to you exercise? 3    How many minutes per day do you exercise? 90  Total minutes per week of exercise 270      Patient Education   Previous Diabetes Education Yes (please comment)   GDM with 2nd pregnancy   Disease state  Definition of diabetes, type 1 and 2, and the diagnosis of diabetes;Factors that contribute to the development of diabetes    Nutrition management  Role of diet in the treatment of diabetes and the relationship between the three main macronutrients and blood glucose level;Food label reading, portion sizes and measuring food.;Reviewed blood glucose goals for pre and post meals and how to evaluate the patients' food intake on their blood glucose level.    Physical  activity and exercise  Role of exercise on diabetes management, blood pressure control and cardiac health.    Medications Other (comment)   limited use of oral medications during pregnancy and potential for insulin   Monitoring Taught/evaluated SMBG meter.;Purpose and frequency of SMBG.;Taught/discussed recording of test results and interpretation of SMBG.;Identified appropriate SMBG and/or A1C goals.;Ketone testing, when, how.    Chronic complications Relationship between chronic complications and blood glucose control    Psychosocial adjustment Identified and addressed patients feelings and concerns about diabetes    Preconception care Pregnancy and GDM  Role of pre-pregnancy blood glucose control on the development of the fetus;Reviewed with patient blood glucose goals with pregnancy;Role of family planning for patients with diabetes      Individualized Goals (developed by patient)   Reducing Risk Other (comment)   lead a healthier lifestyle, become more fit     Outcomes   Expected Outcomes Demonstrated interest in learning. Expect positive outcomes    Future DMSE --   3 weeks       Individualized Plan for Diabetes Self-Management Training:   Learning Objective:  Patient will have a greater understanding of diabetes self-management. Patient education plan is to attend individual and/or group sessions per assessed needs and concerns.   Plan:   Patient Instructions  Read booklet on Gestational Diabetes Follow Gestational Meal Planning Guidelines Allow 2-3 hours between meals and snacks Limit fruit at bedtime Add 1 protein serving when eating fruit for a snack Complete a 3 Day Food Record and bring to next appointment Check blood sugars 4 x day - before breakfast and 2 hrs after every meal and record  Bring blood sugar log to all appointments Call MD for prescription for meter strips and lancets Strips   Accu-Chek Guide  Lancets   Accu-Chek Softclix Purchase urine ketone strips if  instructed by MD and check urine ketones every am:  If + increase bedtime snack to 1 protein and 2 carbohydrate servings Walk 20-30 minutes at least 5 x week if permitted by MD   Expected Outcomes:  Demonstrated interest in learning. Expect positive outcomes  Education material provided:  Gestational Booklet Gestational Meal Planning Guidelines Simple Meal Plan Meter = Accu-Chek Guide Me 3 Day Food Record Goals for a Healthy Pregnancy   If problems or questions, patient to contact team via:   Sharion Settler, RN, CCM, CDCES [phone]  Future DSME appointment:  (3 weeks) September 17, 2021 with the dietitian

## 2021-08-24 NOTE — Progress Notes (Signed)
ROB: Patient notes Jordan Mathis and increased vaginal discharge (knows this is normal).  Is noting some issues with hypoglycemia in the mornings sometimes.  Does observe that when she works longer hours (12 hr shifts) she does not eat much at night and those are the mornings that she feels this.  She was diagnosed with GDM this pregnancy, has [facility] appt later today, can get better insight into blood sugars after visit. Discussed breastfeeding topics, pain management in labor, desires to try to go without an epidural if possible. Advised on contraception, will consider the patch.  Continues to decline Tdap and flu vaccine.  RTC in 2 weeks.

## 2021-08-26 ENCOUNTER — Telehealth: Payer: Self-pay | Admitting: Obstetrics and Gynecology

## 2021-08-26 NOTE — Telephone Encounter (Signed)
Pt called checking on a RX for lancets- she said she is about to run out. Please Advise.

## 2021-08-28 ENCOUNTER — Other Ambulatory Visit: Payer: Self-pay

## 2021-08-28 MED ORDER — ACCU-CHEK SOFTCLIX LANCETS MISC: 12 refills | Status: DC

## 2021-08-28 MED ORDER — GLUCOSE BLOOD VI STRP: ORAL_STRIP | 12 refills | Status: DC

## 2021-08-28 NOTE — Telephone Encounter (Signed)
Patient called no answer LM via Vm that I would send in lancets and test strips to her pharmacy [facility].

## 2021-08-28 NOTE — Telephone Encounter (Signed)
Called pt no answer lm inform pt that I sent in lancets and strips to [facility] in [place]. Pt called back while I was placing the order and was informed.

## 2021-09-08 ENCOUNTER — Other Ambulatory Visit: Payer: Self-pay

## 2021-09-08 ENCOUNTER — Encounter: Payer: Self-pay | Admitting: Obstetrics and Gynecology

## 2021-09-08 ENCOUNTER — Ambulatory Visit (INDEPENDENT_AMBULATORY_CARE_PROVIDER_SITE_OTHER): Payer: Medicaid Other | Admitting: Obstetrics and Gynecology

## 2021-09-08 VITALS — BP 115/79 | HR 97 | Wt 200.5 lb

## 2021-09-08 DIAGNOSIS — O24410 Gestational diabetes mellitus in pregnancy, diet controlled: Secondary | ICD-10-CM

## 2021-09-08 DIAGNOSIS — Z3483 Encounter for supervision of other normal pregnancy, third trimester: Secondary | ICD-10-CM

## 2021-09-08 DIAGNOSIS — Z3A32 32 weeks gestation of pregnancy: Secondary | ICD-10-CM

## 2021-09-08 LAB — POCT URINALYSIS DIPSTICK OB
Bilirubin, UA: NEGATIVE
Blood, UA: NEGATIVE
Glucose, UA: NEGATIVE
Ketones, UA: NEGATIVE
Nitrite, UA: NEGATIVE
POC,PROTEIN,UA: NEGATIVE
Spec Grav, UA: 1.02 (ref 1.010–1.025)
Urobilinogen, UA: 0.2 E.U./dL
pH, UA: 6 (ref 5.0–8.0)

## 2021-09-08 NOTE — Progress Notes (Signed)
ROB: Doing well.  Reports a partial resolution of pelvic pressure symptoms as the "baby has moved up".  She is doing her sugars but failed to bring her log today.  She reports most of them are between 92 and 105.  Encouraged to bring her booklet with her every visit.  Has follow-up ultrasound for growth already scheduled.

## 2021-09-17 ENCOUNTER — Ambulatory Visit: Payer: 59 | Admitting: Dietician

## 2021-09-18 ENCOUNTER — Telehealth: Payer: Self-pay | Admitting: Dietician

## 2021-09-18 NOTE — Telephone Encounter (Signed)
Left message regarding patient's missed appointment from 09/17/21. Asked if she could come on 09/24/21 at 11am, only available time for next 2-3 weeks. Requested a call back.

## 2021-09-21 ENCOUNTER — Other Ambulatory Visit: Payer: Self-pay

## 2021-09-21 ENCOUNTER — Encounter: Payer: Self-pay | Admitting: Obstetrics and Gynecology

## 2021-09-21 ENCOUNTER — Ambulatory Visit (INDEPENDENT_AMBULATORY_CARE_PROVIDER_SITE_OTHER): Payer: Medicaid Other | Admitting: Obstetrics and Gynecology

## 2021-09-21 VITALS — BP 121/86 | HR 111 | Wt 205.1 lb

## 2021-09-21 DIAGNOSIS — Z3483 Encounter for supervision of other normal pregnancy, third trimester: Secondary | ICD-10-CM

## 2021-09-21 DIAGNOSIS — O24415 Gestational diabetes mellitus in pregnancy, controlled by oral hypoglycemic drugs: Secondary | ICD-10-CM

## 2021-09-21 DIAGNOSIS — O34219 Maternal care for unspecified type scar from previous cesarean delivery: Secondary | ICD-10-CM | POA: Diagnosis not present

## 2021-09-21 DIAGNOSIS — O09299 Supervision of pregnancy with other poor reproductive or obstetric history, unspecified trimester: Secondary | ICD-10-CM

## 2021-09-21 DIAGNOSIS — Z3A34 34 weeks gestation of pregnancy: Secondary | ICD-10-CM

## 2021-09-21 DIAGNOSIS — R0989 Other specified symptoms and signs involving the circulatory and respiratory systems: Secondary | ICD-10-CM | POA: Diagnosis not present

## 2021-09-21 LAB — POCT URINALYSIS DIPSTICK OB
Bilirubin, UA: NEGATIVE
Blood, UA: NEGATIVE
Glucose, UA: NEGATIVE
Ketones, UA: NEGATIVE
Leukocytes, UA: NEGATIVE
Nitrite, UA: NEGATIVE
POC,PROTEIN,UA: NEGATIVE
Spec Grav, UA: <=1.005 — AB (ref 1.010–1.025)
Urobilinogen, UA: 0.2 E.U./dL
pH, UA: 7 (ref 5.0–8.0)

## 2021-09-21 MED ORDER — GLYBURIDE 2.5 MG PO TABS: 2.5000 mg | ORAL_TABLET | Freq: Every day | ORAL | 0 refills | Status: DC

## 2021-09-21 NOTE — Progress Notes (Signed)
ROB. Patient states she has been feeling sick this week, complains of sore throat. Patient also states she has not been feeling the baby move as much as she has been.

## 2021-09-21 NOTE — Progress Notes (Signed)
ROB: Feeling sick since the weekend.  No other sick contacts. Notes sore throat and chills.  Was tested for COVID but was negative. Taking Tylenol.  Discussed other OTC remedies.  Also noting some decreased fetal movement over past several days. Blood sugars reviewed. Most fastings elevated (102-130).  Postprandials less than 130s (although only checking once or twice per day).  Will start Glyburide 2.5 mg at dinner. For growth scan and cultures next visit. Also to begin NSTs next visit.

## 2021-09-23 ENCOUNTER — Encounter: Payer: Self-pay | Admitting: Dietician

## 2021-09-23 NOTE — Progress Notes (Signed)
Have not heard back from patient to reschedule her missed appointment from 09/17/21. Sent notification to referring provider.

## 2021-10-05 ENCOUNTER — Ambulatory Visit (INDEPENDENT_AMBULATORY_CARE_PROVIDER_SITE_OTHER): Payer: BC Managed Care – PPO | Admitting: Obstetrics and Gynecology

## 2021-10-05 ENCOUNTER — Encounter: Payer: Self-pay | Admitting: Obstetrics and Gynecology

## 2021-10-05 ENCOUNTER — Other Ambulatory Visit: Payer: Self-pay

## 2021-10-05 ENCOUNTER — Other Ambulatory Visit: Payer: Medicaid Other

## 2021-10-05 VITALS — BP 125/84 | HR 94 | Wt 205.3 lb

## 2021-10-05 DIAGNOSIS — O368190 Decreased fetal movements, unspecified trimester, not applicable or unspecified: Secondary | ICD-10-CM

## 2021-10-05 DIAGNOSIS — Z3403 Encounter for supervision of normal first pregnancy, third trimester: Secondary | ICD-10-CM | POA: Diagnosis not present

## 2021-10-05 DIAGNOSIS — Z113 Encounter for screening for infections with a predominantly sexual mode of transmission: Secondary | ICD-10-CM

## 2021-10-05 DIAGNOSIS — R319 Hematuria, unspecified: Secondary | ICD-10-CM

## 2021-10-05 DIAGNOSIS — Z3A36 36 weeks gestation of pregnancy: Secondary | ICD-10-CM

## 2021-10-05 LAB — POCT URINALYSIS DIPSTICK OB
Bilirubin, UA: NEGATIVE
Blood, UA: NEGATIVE
Glucose, UA: NEGATIVE
Ketones, UA: NEGATIVE
Nitrite, UA: NEGATIVE
Spec Grav, UA: 1.015 (ref 1.010–1.025)
Urobilinogen, UA: 0.2 E.U./dL
pH, UA: 7 (ref 5.0–8.0)

## 2021-10-05 NOTE — Addendum Note (Signed)
Addended by: Tommie Raymond on: 10/05/2021 12:28 PM   Modules accepted: Orders

## 2021-10-05 NOTE — Progress Notes (Signed)
ROB: She said she has decreased movement.

## 2021-10-05 NOTE — Progress Notes (Signed)
ROB: Has no complaints.  As noted "decreased fetal movement" but says it was the same as last week.  NST today reactive.  Irritability noted.  Urine for C&S.  Patient thinks she may have yeast infection- Monistat advised.  Generally not compliant with sugar monitoring.  Have stressed the importance of glycemic control and regular meals with 2-hour postprandial monitoring.  Patient says she will do better for next week.

## 2021-10-06 ENCOUNTER — Other Ambulatory Visit: Payer: Self-pay

## 2021-10-06 ENCOUNTER — Ambulatory Visit
Admission: RE | Admit: 2021-10-06 | Discharge: 2021-10-06 | Disposition: A | Discharge status: Home / Self Care | Payer: Medicaid Other | Source: Ambulatory Visit | Attending: Obstetrics and Gynecology | Admitting: Obstetrics and Gynecology

## 2021-10-06 DIAGNOSIS — O09299 Supervision of pregnancy with other poor reproductive or obstetric history, unspecified trimester: Secondary | ICD-10-CM | POA: Diagnosis present

## 2021-10-06 DIAGNOSIS — O24410 Gestational diabetes mellitus in pregnancy, diet controlled: Secondary | ICD-10-CM | POA: Diagnosis present

## 2021-10-07 LAB — STREP GP B NAA+RFLX: Strep Gp B NAA+Rflx: NEGATIVE

## 2021-10-08 LAB — GC/CHLAMYDIA PROBE AMP
Chlamydia trachomatis, NAA: NEGATIVE
Neisseria Gonorrhoeae by PCR: NEGATIVE

## 2021-10-09 LAB — URINE CULTURE

## 2021-10-14 ENCOUNTER — Other Ambulatory Visit: Payer: Self-pay

## 2021-10-14 ENCOUNTER — Encounter: Payer: Self-pay | Admitting: Obstetrics and Gynecology

## 2021-10-14 ENCOUNTER — Other Ambulatory Visit: Payer: BC Managed Care – PPO

## 2021-10-14 ENCOUNTER — Ambulatory Visit (INDEPENDENT_AMBULATORY_CARE_PROVIDER_SITE_OTHER): Payer: BC Managed Care – PPO | Admitting: Obstetrics and Gynecology

## 2021-10-14 VITALS — BP 148/91 | HR 78 | Wt 210.0 lb

## 2021-10-14 DIAGNOSIS — O34219 Maternal care for unspecified type scar from previous cesarean delivery: Secondary | ICD-10-CM

## 2021-10-14 DIAGNOSIS — O24415 Gestational diabetes mellitus in pregnancy, controlled by oral hypoglycemic drugs: Secondary | ICD-10-CM | POA: Diagnosis not present

## 2021-10-14 DIAGNOSIS — O0993 Supervision of high risk pregnancy, unspecified, third trimester: Secondary | ICD-10-CM | POA: Diagnosis not present

## 2021-10-14 DIAGNOSIS — O163 Unspecified maternal hypertension, third trimester: Secondary | ICD-10-CM | POA: Diagnosis not present

## 2021-10-14 DIAGNOSIS — Z8759 Personal history of other complications of pregnancy, childbirth and the puerperium: Secondary | ICD-10-CM

## 2021-10-14 DIAGNOSIS — O09299 Supervision of pregnancy with other poor reproductive or obstetric history, unspecified trimester: Secondary | ICD-10-CM

## 2021-10-14 LAB — POCT URINALYSIS DIPSTICK OB
Bilirubin, UA: NEGATIVE
Blood, UA: NEGATIVE
Glucose, UA: NEGATIVE
Ketones, UA: NEGATIVE
Nitrite, UA: NEGATIVE
POC,PROTEIN,UA: NEGATIVE
Spec Grav, UA: 1.015 (ref 1.010–1.025)
Urobilinogen, UA: 0.2 E.U./dL
pH, UA: 7 (ref 5.0–8.0)

## 2021-10-14 NOTE — Progress Notes (Signed)
ROB: Patient noting that she is "not feeling like herself".  Notes that she still has not been very compliant with monitoring her blood sugars, but that several of them have been high.  BPs also noted to be elevated today, patient with prior h/o pre-eclampsia. Will get baseline PIH labs.  Discussed likelihood of delivery at 38th week instead of 39th.  Will schedule for . NST performed today was reviewed and was found to be reactive.  Continue recommended antenatal testing and prenatal care. RTC in 2 days for repeat BP check.     NONSTRESS TEST INTERPRETATION  INDICATIONS:  GDM on Glyburide (uncontrolled) and elevated BP reading today  FHR baseline: 140s  RESULTS:Reactive COMMENTS: Occasional variable deceleration (2 noted in 25 minute period), shallow. Uterine irritability with occasional contraction   PLAN: 1. Continue fetal kick counts twice a day. 2. Continue antepartum testing as scheduled-weekly

## 2021-10-14 NOTE — Patient Instructions (Signed)
COVID 19 Instructions for Scheduled Procedure (Inductions/C-sections and GYN surgeries)   Thank you for choosing [facility] for your services.  You have been scheduled for a procedure called ______Induction of Labor__________.    Your procedure is scheduled on ______Tuesday, October 20, 2021 at 6:00 A.M.____________________________.  You are required to have COVID-19 testing performed 2 days prior to your scheduled procedure date.  Testing is performed between 8 AM and 12 PM Monday through Friday.  Please present for testing on ___Friday, December 30, 2022________ during this hour. Testing is performed in front of the Medical Mall in the Pre-Admission Testing area on the second floor. Please call [phone] to schedule your COVID testing.     Upon your scheduled procedure date, you will need to arrive at the Medical Mall entrance. (There is a statue at the front of this entrance.)   Please arrive on time if you are scheduled for an induction of labor.   If you are scheduled for a Cesarean delivery or for Gyn Surgery, arrive 2 hours prior to your procedure time.  If you are an Obstetric patient and your arrival time falls between 11 PM and 6 AM call L&D [phone]) when you arrive.  A staff member will meet you at the [facility] Mall entrance.  At this time, patients are allowed 2 support persons to accompany them. Face masks are required for you and your support person(s). Your support person(s) are now allowed to be there with you during the entire time of your admission.   Please contact the office if you have any questions regarding this information.  The [facility] office number is [phone].     Thank you,    Your [facility] Providers     Labor Induction Labor induction is when steps are taken to cause a pregnant woman to begin the labor process. Most women go into labor on their own between 37 weeks and 42 weeks of pregnancy. When this does not happen, or  when there is a medical need for labor to begin, steps may be taken to induce, or bring on, labor. Labor induction causes a pregnant woman's uterus to contract. It also causes the cervix to soften (ripen), open (dilate), and thin out. Usually, labor is not induced before 39 weeks of pregnancy unless there is a medical reason to do so. When is labor induction considered? Labor induction may be right for you if: Your pregnancy lasts longer than 41 to 42 weeks. Your placenta is separating from your uterus (placental abruption). You have a rupture of membranes and your labor does not begin. You have health problems, like diabetes or high blood pressure (preeclampsia) during your pregnancy. Your baby has stopped growing or does not have enough amniotic fluid. Before labor induction begins, your health care provider will consider the following factors: Your medical condition and the baby's condition. How many weeks you have been pregnant. How mature the baby's lungs are. The condition of your cervix. The position of the baby. The size of your birth canal. Tell a health care provider about: Any allergies you have. All medicines you are taking, including vitamins, herbs, eye drops, creams, and over-the-counter medicines. Any problems you or your family members have had with anesthetic medicines. Any surgeries you have had. Any blood disorders you have. Any medical conditions you have. What are the risks? Generally, this is a safe procedure. However, problems may occur, including: Failed induction. Changes in fetal heart rate, such as being  too high, too low, or irregular (erratic). Infection in the mother or the baby. Increased risk of having a cesarean delivery. Breaking off (abruption) of the placenta from the uterus. This is rare. Rupture of the uterus. This is very rare. Your baby could fail to get enough blood flow or oxygen. This can be life-threatening. When induction is needed for  medical reasons, the benefits generally outweigh the risks. What happens during the procedure? During the procedure, your health care provider will use one of these methods to induce labor: Stripping the membranes. In this method, the amniotic sac tissue is gently separated from the cervix. This causes the following to happen: Your cervix stretches, which in turn causes the release of prostaglandins. Prostaglandins induce labor and cause the uterus to contract. This procedure is often done in an office visit. You will be sent home to wait for contractions to begin. Prostaglandin medicine. This medicine starts contractions and causes the cervix to dilate and ripen. This can be taken by mouth (orally) or by being inserted into the vagina (suppository). Inserting a small, thin tube (catheter) with a balloon into the vagina and then expanding the balloon with water to dilate the cervix. Breaking the water. In this method, a small instrument is used to make a small hole in the amniotic sac. This eventually causes the amniotic sac to break. Contractions should begin within a few hours. Medicine to trigger or strengthen contractions. This medicine is given through an IV that is inserted into a vein in your arm. This procedure may vary among health care providers and hospitals. Where to find more information [employer]: www.marchofdimes.org [employer] of [employer]stetricians and [employer]necologists: www.acog.org Summary Labor induction causes a pregnant woman's uterus to contract. It also causes the cervix to soften (ripen), open (dilate), and thin out. Labor is usually not induced before 39 weeks of pregnancy unless there is a medical reason to do so. When induction is needed for medical reasons, the benefits generally outweigh the risks. Talk with your health care provider about which methods of labor induction are right for you. This information is not intended to replace advice given to you by your  health care provider. Make sure you discuss any questions you have with your health care provider. Document Revised: 07/17/2020 Document Reviewed: 07/17/2020 Elsevier Patient Education  2022 [employer].

## 2021-10-14 NOTE — Progress Notes (Signed)
Jordan Mathis. Patient states today having high blood pressures along with headaches and nausea starting 10/09/21. Patient reports fetal movement and pressure.  Patient states no other questions or concerns.

## 2021-10-15 ENCOUNTER — Encounter: Payer: Self-pay | Admitting: Obstetrics and Gynecology

## 2021-10-15 DIAGNOSIS — O1400 Mild to moderate pre-eclampsia, unspecified trimester: Secondary | ICD-10-CM | POA: Insufficient documentation

## 2021-10-15 LAB — COMPREHENSIVE METABOLIC PANEL
ALT: 15 IU/L (ref 0–32)
AST: 16 IU/L (ref 0–40)
Albumin/Globulin Ratio: 1 — ABNORMAL LOW (ref 1.2–2.2)
Albumin: 3.3 g/dL — ABNORMAL LOW (ref 3.8–4.8)
Alkaline Phosphatase: 190 IU/L — ABNORMAL HIGH (ref 44–121)
BUN/Creatinine Ratio: 7 — ABNORMAL LOW (ref 9–23)
BUN: 4 mg/dL — ABNORMAL LOW (ref 6–20)
Bilirubin Total: 0.3 mg/dL (ref 0.0–1.2)
CO2: 22 mmol/L (ref 20–29)
Calcium: 8.9 mg/dL (ref 8.7–10.2)
Chloride: 103 mmol/L (ref 96–106)
Creatinine, Ser: 0.57 mg/dL (ref 0.57–1.00)
Globulin, Total: 3.2 g/dL (ref 1.5–4.5)
Glucose: 69 mg/dL — ABNORMAL LOW (ref 70–99)
Potassium: 4.2 mmol/L (ref 3.5–5.2)
Sodium: 137 mmol/L (ref 134–144)
Total Protein: 6.5 g/dL (ref 6.0–8.5)
eGFR: 123 mL/min/{1.73_m2} (ref >59)

## 2021-10-15 LAB — CBC
Hematocrit: 32.9 % — ABNORMAL LOW (ref 34.0–46.6)
Hemoglobin: 10.8 g/dL — ABNORMAL LOW (ref 11.1–15.9)
MCH: 26.3 pg — ABNORMAL LOW (ref 26.6–33.0)
MCHC: 32.8 g/dL (ref 31.5–35.7)
MCV: 80 fL (ref 79–97)
Platelets: 305 10*3/uL (ref 150–450)
RBC: 4.11 x10E6/uL (ref 3.77–5.28)
RDW: 14.1 % (ref 11.7–15.4)
WBC: 6.7 10*3/uL (ref 3.4–10.8)

## 2021-10-15 LAB — PROTEIN / CREATININE RATIO, URINE
Creatinine, Urine: 90.4 mg/dL
Protein, Ur: 32.3 mg/dL
Protein/Creat Ratio: 357 mg/g creat — ABNORMAL HIGH (ref 0–200)

## 2021-10-16 ENCOUNTER — Ambulatory Visit (INDEPENDENT_AMBULATORY_CARE_PROVIDER_SITE_OTHER): Payer: BC Managed Care – PPO | Admitting: Obstetrics

## 2021-10-16 ENCOUNTER — Other Ambulatory Visit
Admission: RE | Admit: 2021-10-16 | Discharge: 2021-10-16 | Disposition: A | Discharge status: Home / Self Care | Payer: BC Managed Care – PPO | Source: Ambulatory Visit | Attending: Obstetrics and Gynecology | Admitting: Obstetrics and Gynecology

## 2021-10-16 ENCOUNTER — Encounter: Payer: Self-pay | Admitting: Obstetrics and Gynecology

## 2021-10-16 ENCOUNTER — Observation Stay
Admission: RE | Admit: 2021-10-16 | Discharge: 2021-10-16 | Disposition: A | Discharge status: Home / Self Care | Payer: BC Managed Care – PPO | Source: Ambulatory Visit | Attending: Obstetrics and Gynecology | Admitting: Obstetrics and Gynecology

## 2021-10-16 ENCOUNTER — Other Ambulatory Visit: Payer: Self-pay

## 2021-10-16 ENCOUNTER — Other Ambulatory Visit: Payer: Self-pay | Admitting: Obstetrics

## 2021-10-16 VITALS — BP 145/93 | HR 93 | Wt 207.5 lb

## 2021-10-16 DIAGNOSIS — O0993 Supervision of high risk pregnancy, unspecified, third trimester: Secondary | ICD-10-CM

## 2021-10-16 DIAGNOSIS — Z01818 Encounter for other preprocedural examination: Secondary | ICD-10-CM

## 2021-10-16 DIAGNOSIS — Z3A37 37 weeks gestation of pregnancy: Secondary | ICD-10-CM | POA: Insufficient documentation

## 2021-10-16 DIAGNOSIS — O26893 Other specified pregnancy related conditions, third trimester: Secondary | ICD-10-CM | POA: Diagnosis not present

## 2021-10-16 DIAGNOSIS — Z01812 Encounter for preprocedural laboratory examination: Secondary | ICD-10-CM | POA: Diagnosis present

## 2021-10-16 DIAGNOSIS — O161 Unspecified maternal hypertension, first trimester: Secondary | ICD-10-CM | POA: Diagnosis present

## 2021-10-16 DIAGNOSIS — O139 Gestational [pregnancy-induced] hypertension without significant proteinuria, unspecified trimester: Secondary | ICD-10-CM | POA: Diagnosis not present

## 2021-10-16 DIAGNOSIS — R1011 Right upper quadrant pain: Secondary | ICD-10-CM | POA: Diagnosis not present

## 2021-10-16 DIAGNOSIS — Z20822 Contact with and (suspected) exposure to covid-19: Secondary | ICD-10-CM | POA: Diagnosis not present

## 2021-10-16 DIAGNOSIS — O163 Unspecified maternal hypertension, third trimester: Secondary | ICD-10-CM | POA: Diagnosis present

## 2021-10-16 LAB — CBC
HCT: 30.1 % — ABNORMAL LOW (ref 36.0–46.0)
Hemoglobin: 9.6 g/dL — ABNORMAL LOW (ref 12.0–15.0)
MCH: 26.4 pg (ref 26.0–34.0)
MCHC: 31.9 g/dL (ref 30.0–36.0)
MCV: 82.7 fL (ref 80.0–100.0)
Platelets: 267 10*3/uL (ref 150–400)
RBC: 3.64 MIL/uL — ABNORMAL LOW (ref 3.87–5.11)
RDW: 14.6 % (ref 11.5–15.5)
WBC: 7.1 10*3/uL (ref 4.0–10.5)
nRBC: 0 % (ref 0.0–0.2)

## 2021-10-16 LAB — COMPREHENSIVE METABOLIC PANEL
ALT: 15 U/L (ref 0–44)
AST: 20 U/L (ref 15–41)
Albumin: 2.6 g/dL — ABNORMAL LOW (ref 3.5–5.0)
Alkaline Phosphatase: 152 U/L — ABNORMAL HIGH (ref 38–126)
Anion gap: 6 (ref 5–15)
BUN: 6 mg/dL (ref 6–20)
CO2: 23 mmol/L (ref 22–32)
Calcium: 8.3 mg/dL — ABNORMAL LOW (ref 8.9–10.3)
Chloride: 105 mmol/L (ref 98–111)
Creatinine, Ser: 0.59 mg/dL (ref 0.44–1.00)
GFR, Estimated: >60 mL/min (ref >60)
Glucose, Bld: 86 mg/dL (ref 70–99)
Potassium: 3.3 mmol/L — ABNORMAL LOW (ref 3.5–5.1)
Sodium: 134 mmol/L — ABNORMAL LOW (ref 135–145)
Total Bilirubin: 0.7 mg/dL (ref 0.3–1.2)
Total Protein: 6.5 g/dL (ref 6.5–8.1)

## 2021-10-16 LAB — PROTEIN / CREATININE RATIO, URINE
Creatinine, Urine: 237 mg/dL
Protein Creatinine Ratio: 0.23 mg/mg{Cre} — ABNORMAL HIGH (ref 0.00–0.15)
Total Protein, Urine: 55 mg/dL

## 2021-10-16 NOTE — Progress Notes (Signed)
Here for BP check. BP is 145/93. Kailea reports a headache that has been present for about a week and right upper quadrant pain that has been present about a week and has worsened over the past 2 days. Tender to palpation. States she feels generally unwell and not like herself. Good fetal movement. Denies ctx, LOF, and vaginal bleeding.   Consulted with Dr. Logan Bores who advised sending Jordan Mathis to L&D for pre-eclampsia workup up. Pt notified and in agreement with plan.

## 2021-10-16 NOTE — OB Triage Note (Signed)
Patient is G4P2, [redacted]w[redacted]d that was sent over from the office for [facility] evaluation. Patient complains of headache and right upper epigastric pain when she "pushes on the area or with activity". Patient denies any visual disturbances. +1 pitting edema in lower extremities. BP 139/85. VSS. Patient denies any LOF or bleeding. No contractions per patient. Patient feels positive fetal movement. Monitors applied and assessing.

## 2021-10-16 NOTE — Addendum Note (Signed)
Addended by: Guadlupe Spanish on: 10/16/2021 11:50 AM   Modules accepted: Orders

## 2021-10-16 NOTE — Progress Notes (Signed)
Patient given discharge instructions, patient verbalized understanding. Patient discharged home by self in wheelchair, taken to [facility] entrance by volunteers.

## 2021-10-17 LAB — SARS CORONAVIRUS 2 (TAT 6-24 HRS): SARS Coronavirus 2: NEGATIVE

## 2021-10-17 NOTE — Discharge Summary (Signed)
° ° °  L&D OB Triage Note  SUBJECTIVE Jordan Mathis is a 35 y.o. [id number] female at 109w6d, EDD Estimated Date of Delivery: 11/01/21 who was sent from [facility] to L&D for [facility] evaluation.  She had a moderate increase in blood pressure noted in the office as well as complaint of mild headache and right upper quadrant pain especially with movement and twisting.  She denies vaginal bleeding or leakage of fluid.  OB History  Gravida Para Term Preterm AB Living  4 2 2  0 1 2  SAB IAB Ectopic Multiple Live Births  0 1 0 0 2    # Outcome Date GA Lbr Len/2nd Weight Sex Delivery Anes PTL Lv  4 Current           3 Term 10/02/14 [redacted]w[redacted]d  3912 g F CS-Unspec   LIV     Birth Comments: [facility], C-section due to infant size     Complications: Gestational diabetes mellitus  2 Term 06/24/12 [redacted]w[redacted]d  3345 g F Vag-Spont   LIV     Birth Comments: [facility]     Complications: Pre-eclampsia, third trimester  1 IAB             No medications prior to admission.     OBJECTIVE  Nursing Evaluation:   BP (!) 142/80    Pulse 97    Temp 98.6 F (37 C) (Oral)    Resp 18    Ht 5\' 3"  (1.6 m)    Wt 94.2 kg    LMP 01/25/2021 (Exact Date)    BMI 36.79 kg/m    Findings:        No evidence of preeclampsia at this time.     PC ratio normal and slightly improved since previous testing     LFTs normal     Platelets normal     NST was performed and has been reviewed by me.  NST INTERPRETATION: Category I  Mode: External Baseline Rate (A): 140 bpm Variability: Moderate Accelerations: 15 x 15 Decelerations: None     Contraction Frequency (min): UI  ASSESSMENT Impression:  1.  Pregnancy:  at [redacted]w[redacted]d , EDD Estimated Date of Delivery: 11/01/21 2.  Reassuring fetal and maternal status 3.  Not currently preeclamptic  PLAN 1. Current condition and above findings reviewed.  Reassuring fetal and maternal condition. 2. Discharge home with standard labor precautions given to return to L&D or call the office for  problems. 3. Continue routine prenatal care with close office follow-up for hypertension/preeclampsia.

## 2021-10-18 NOTE — L&D Delivery Note (Signed)
Delivery Summary for Jordan Mathis  Labor Events:   Preterm labor: No data found  Rupture date: 10/20/2021  Rupture time: 12:12 PM  Rupture type: Artificial Intact  Fluid Color: Clear  Induction: No data found  Augmentation: No data found  Complications: No data found  Cervical ripening: No data found No data found   No data found     Delivery:   Episiotomy: No data found  Lacerations: No data found  Repair suture: No data found  Repair # of packets: No data found  Blood loss (ml): 100   Information for the patient's newborn:  Kalyssa, Anker [[id number]]   Delivery 10/20/2021 8:10 PM by  Vaginal, Spontaneous Sex:  female Gestational Age: [redacted]w[redacted]d Delivery Clinician:   Living?:         APGARS  One minute Five minutes Ten minutes  Skin color:        Heart rate:        Grimace:        Muscle tone:        Breathing:        Totals: 8  9      Presentation/position:      Resuscitation:   Cord information:    Disposition of cord blood:     Blood gases sent?  Complications:   Placenta: Delivered:       appearance Newborn Measurements: Weight: 7 lb 0.9 oz (3200 g)  Height: 20.08"  Head circumference:    Chest circumference:    Other providers:    Additional  information: Forceps:   Vacuum:   Breech:   Observed anomalies       Delivery Note At 8:10 PM a viable and healthy female was delivered via Vaginal, Spontaneous (Presentation: Vertex; LOA position).  APGAR: 8, 9; weight 3200 grams .   Placenta status: Spontaneous, Intact.  Cord: 3 vessels with the following complications: None.  Cord pH: not obtained. Delayed cord clamping observed.   Anesthesia: Epidural Episiotomy: None Lacerations: 1st degree, perineal Suture Repair: 3.0 vicryl Est. Blood Loss (mL):  100  Mom to postpartum.  Baby to Couplet care / Skin to Skin.  Hildred Laser, MD 10/20/2021, 8:36 PM

## 2021-10-20 ENCOUNTER — Encounter: Payer: BC Managed Care – PPO | Admitting: Obstetrics and Gynecology

## 2021-10-20 ENCOUNTER — Inpatient Hospital Stay: Payer: Medicaid Other | Admitting: Anesthesiology

## 2021-10-20 ENCOUNTER — Encounter: Payer: Self-pay | Admitting: Obstetrics and Gynecology

## 2021-10-20 ENCOUNTER — Inpatient Hospital Stay
Admission: RE | Admit: 2021-10-20 | Discharge: 2021-10-22 | DRG: 807 | Disposition: A | Discharge status: Home / Self Care | Payer: Medicaid Other | Attending: Obstetrics and Gynecology | Admitting: Obstetrics and Gynecology

## 2021-10-20 ENCOUNTER — Other Ambulatory Visit: Payer: Self-pay

## 2021-10-20 DIAGNOSIS — Z8759 Personal history of other complications of pregnancy, childbirth and the puerperium: Secondary | ICD-10-CM

## 2021-10-20 DIAGNOSIS — Z3A38 38 weeks gestation of pregnancy: Secondary | ICD-10-CM | POA: Diagnosis not present

## 2021-10-20 DIAGNOSIS — O1400 Mild to moderate pre-eclampsia, unspecified trimester: Secondary | ICD-10-CM | POA: Diagnosis present

## 2021-10-20 DIAGNOSIS — O09891 Supervision of other high risk pregnancies, first trimester: Secondary | ICD-10-CM

## 2021-10-20 DIAGNOSIS — Z880 Allergy status to penicillin: Secondary | ICD-10-CM | POA: Diagnosis not present

## 2021-10-20 DIAGNOSIS — O24425 Gestational diabetes mellitus in childbirth, controlled by oral hypoglycemic drugs: Secondary | ICD-10-CM | POA: Diagnosis present

## 2021-10-20 DIAGNOSIS — O09529 Supervision of elderly multigravida, unspecified trimester: Secondary | ICD-10-CM

## 2021-10-20 DIAGNOSIS — O24415 Gestational diabetes mellitus in pregnancy, controlled by oral hypoglycemic drugs: Principal | ICD-10-CM | POA: Diagnosis present

## 2021-10-20 DIAGNOSIS — O1404 Mild to moderate pre-eclampsia, complicating childbirth: Secondary | ICD-10-CM | POA: Diagnosis present

## 2021-10-20 DIAGNOSIS — O09293 Supervision of pregnancy with other poor reproductive or obstetric history, third trimester: Secondary | ICD-10-CM

## 2021-10-20 DIAGNOSIS — O0993 Supervision of high risk pregnancy, unspecified, third trimester: Secondary | ICD-10-CM

## 2021-10-20 DIAGNOSIS — O1403 Mild to moderate pre-eclampsia, third trimester: Secondary | ICD-10-CM

## 2021-10-20 DIAGNOSIS — O0992 Supervision of high risk pregnancy, unspecified, second trimester: Secondary | ICD-10-CM

## 2021-10-20 DIAGNOSIS — O34219 Maternal care for unspecified type scar from previous cesarean delivery: Secondary | ICD-10-CM | POA: Diagnosis present

## 2021-10-20 DIAGNOSIS — O09299 Supervision of pregnancy with other poor reproductive or obstetric history, unspecified trimester: Secondary | ICD-10-CM

## 2021-10-20 DIAGNOSIS — O0990 Supervision of high risk pregnancy, unspecified, unspecified trimester: Secondary | ICD-10-CM

## 2021-10-20 LAB — CBC
HCT: 31.3 % — ABNORMAL LOW (ref 36.0–46.0)
Hemoglobin: 10.2 g/dL — ABNORMAL LOW (ref 12.0–15.0)
MCH: 26.6 pg (ref 26.0–34.0)
MCHC: 32.6 g/dL (ref 30.0–36.0)
MCV: 81.7 fL (ref 80.0–100.0)
Platelets: 255 10*3/uL (ref 150–400)
RBC: 3.83 MIL/uL — ABNORMAL LOW (ref 3.87–5.11)
RDW: 14.6 % (ref 11.5–15.5)
WBC: 6.5 10*3/uL (ref 4.0–10.5)
nRBC: 0.3 % — ABNORMAL HIGH (ref 0.0–0.2)

## 2021-10-20 LAB — PROTEIN / CREATININE RATIO, URINE
Creatinine, Urine: 43 mg/dL
Protein Creatinine Ratio: 0.42 mg/mg{Cre} — ABNORMAL HIGH (ref 0.00–0.15)
Total Protein, Urine: 18 mg/dL

## 2021-10-20 LAB — GLUCOSE, CAPILLARY
Glucose-Capillary: 101 mg/dL — ABNORMAL HIGH (ref 70–99)
Glucose-Capillary: 67 mg/dL — ABNORMAL LOW (ref 70–99)
Glucose-Capillary: 97 mg/dL (ref 70–99)
Glucose-Capillary: 68 mg/dL — ABNORMAL LOW (ref 70–99)

## 2021-10-20 LAB — ABO/RH: ABO/RH(D): B POS

## 2021-10-20 LAB — TYPE AND SCREEN
ABO/RH(D): B POS
Antibody Screen: NEGATIVE

## 2021-10-20 MED ORDER — SIMETHICONE 80 MG PO CHEW: 80.0000 mg | CHEWABLE_TABLET | ORAL | Status: DC | PRN

## 2021-10-20 MED ORDER — LIDOCAINE HCL (PF) 1 % IJ SOLN
30.0000 mL | INTRAMUSCULAR | Status: DC | PRN
  Filled 2021-10-20: qty 30

## 2021-10-20 MED ORDER — PHENYLEPHRINE 40 MCG/ML (10ML) SYRINGE FOR IV PUSH (FOR BLOOD PRESSURE SUPPORT): 80.0000 ug | PREFILLED_SYRINGE | INTRAVENOUS | Status: DC | PRN

## 2021-10-20 MED ORDER — WITCH HAZEL-GLYCERIN EX PADS: 1.0000 "application " | MEDICATED_PAD | CUTANEOUS | Status: DC | PRN

## 2021-10-20 MED ORDER — ACETAMINOPHEN 325 MG PO TABS
650.0000 mg | ORAL_TABLET | ORAL | Status: DC | PRN
  Administered 2021-10-20: 650 mg via ORAL
  Filled 2021-10-20: qty 2

## 2021-10-20 MED ORDER — LACTATED RINGERS IV SOLN: 500.0000 mL | INTRAVENOUS | Status: DC | PRN

## 2021-10-20 MED ORDER — SOD CITRATE-CITRIC ACID 500-334 MG/5ML PO SOLN: 30.0000 mL | ORAL | Status: DC | PRN

## 2021-10-20 MED ORDER — SENNOSIDES-DOCUSATE SODIUM 8.6-50 MG PO TABS
2.0000 | ORAL_TABLET | Freq: Every day | ORAL | Status: DC
  Administered 2021-10-22: 2 via ORAL
  Filled 2021-10-20 (×2): qty 2

## 2021-10-20 MED ORDER — OXYTOCIN BOLUS FROM INFUSION
333.0000 mL | Freq: Once | INTRAVENOUS | Status: AC
  Administered 2021-10-20: 333 mL via INTRAVENOUS

## 2021-10-20 MED ORDER — OXYCODONE-ACETAMINOPHEN 5-325 MG PO TABS: 1.0000 | ORAL_TABLET | ORAL | Status: DC | PRN

## 2021-10-20 MED ORDER — EPHEDRINE 5 MG/ML INJ: 10.0000 mg | INTRAVENOUS | Status: DC | PRN

## 2021-10-20 MED ORDER — TERBUTALINE SULFATE 1 MG/ML IJ SOLN: 0.2500 mg | Freq: Once | INTRAMUSCULAR | Status: DC | PRN

## 2021-10-20 MED ORDER — DIPHENHYDRAMINE HCL 50 MG/ML IJ SOLN: 12.5000 mg | INTRAMUSCULAR | Status: DC | PRN

## 2021-10-20 MED ORDER — LACTATED RINGERS IV SOLN
500.0000 mL | Freq: Once | INTRAVENOUS | Status: AC
  Administered 2021-10-20: 500 mL via INTRAVENOUS

## 2021-10-20 MED ORDER — LIDOCAINE-EPINEPHRINE (PF) 1.5 %-1:200000 IJ SOLN
INTRAMUSCULAR | Status: DC | PRN
  Administered 2021-10-20: 3 mL via PERINEURAL

## 2021-10-20 MED ORDER — OXYCODONE-ACETAMINOPHEN 5-325 MG PO TABS: 2.0000 | ORAL_TABLET | ORAL | Status: DC | PRN

## 2021-10-20 MED ORDER — LABETALOL HCL 5 MG/ML IV SOLN: 40.0000 mg | INTRAVENOUS | Status: DC | PRN

## 2021-10-20 MED ORDER — LIDOCAINE HCL (PF) 1 % IJ SOLN
INTRAMUSCULAR | Status: AC
  Filled 2021-10-20: qty 30

## 2021-10-20 MED ORDER — ONDANSETRON HCL 4 MG PO TABS: 4.0000 mg | ORAL_TABLET | ORAL | Status: DC | PRN

## 2021-10-20 MED ORDER — MISOPROSTOL 200 MCG PO TABS
ORAL_TABLET | ORAL | Status: AC
  Filled 2021-10-20: qty 4

## 2021-10-20 MED ORDER — OXYTOCIN-SODIUM CHLORIDE 30-0.9 UT/500ML-% IV SOLN
2.5000 [IU]/h | INTRAVENOUS | Status: DC
  Administered 2021-10-20: 2.5 [IU]/h via INTRAVENOUS

## 2021-10-20 MED ORDER — ONDANSETRON HCL 4 MG/2ML IJ SOLN
4.0000 mg | Freq: Four times a day (QID) | INTRAMUSCULAR | Status: DC | PRN
  Administered 2021-10-20 (×2): 4 mg via INTRAVENOUS
  Filled 2021-10-20 (×2): qty 2

## 2021-10-20 MED ORDER — COCONUT OIL OIL: 1.0000 "application " | TOPICAL_OIL | Status: DC | PRN

## 2021-10-20 MED ORDER — NIFEDIPINE 10 MG PO CAPS: 20.0000 mg | ORAL_CAPSULE | ORAL | Status: DC | PRN

## 2021-10-20 MED ORDER — DIPHENHYDRAMINE HCL 25 MG PO CAPS: 25.0000 mg | ORAL_CAPSULE | Freq: Four times a day (QID) | ORAL | Status: DC | PRN

## 2021-10-20 MED ORDER — NIFEDIPINE 10 MG PO CAPS: 10.0000 mg | ORAL_CAPSULE | ORAL | Status: DC | PRN

## 2021-10-20 MED ORDER — DIBUCAINE (PERIANAL) 1 % EX OINT: 1.0000 "application " | TOPICAL_OINTMENT | CUTANEOUS | Status: DC | PRN

## 2021-10-20 MED ORDER — ACETAMINOPHEN 325 MG PO TABS
650.0000 mg | ORAL_TABLET | ORAL | Status: DC | PRN
  Administered 2021-10-20 – 2021-10-22 (×7): 650 mg via ORAL
  Filled 2021-10-20 (×9): qty 2

## 2021-10-20 MED ORDER — BENZOCAINE-MENTHOL 20-0.5 % EX AERO
1.0000 "application " | INHALATION_SPRAY | CUTANEOUS | Status: DC | PRN
  Administered 2021-10-20: 1 via TOPICAL
  Filled 2021-10-20 (×2): qty 56

## 2021-10-20 MED ORDER — SODIUM CHLORIDE 0.9 % IV SOLN
INTRAVENOUS | Status: DC | PRN
  Administered 2021-10-20: 3 mL via EPIDURAL
  Administered 2021-10-20: 5 mL via EPIDURAL

## 2021-10-20 MED ORDER — ONDANSETRON HCL 4 MG/2ML IJ SOLN: 4.0000 mg | INTRAMUSCULAR | Status: DC | PRN

## 2021-10-20 MED ORDER — FENTANYL-BUPIVACAINE-NACL 0.5-0.125-0.9 MG/250ML-% EP SOLN
EPIDURAL | Status: AC
  Filled 2021-10-20: qty 250

## 2021-10-20 MED ORDER — LIDOCAINE HCL (PF) 1 % IJ SOLN
INTRAMUSCULAR | Status: DC | PRN
  Administered 2021-10-20: 3 mL

## 2021-10-20 MED ORDER — BUTORPHANOL TARTRATE 1 MG/ML IJ SOLN: 1.0000 mg | INTRAMUSCULAR | Status: DC | PRN

## 2021-10-20 MED ORDER — IBUPROFEN 600 MG PO TABS
600.0000 mg | ORAL_TABLET | Freq: Four times a day (QID) | ORAL | Status: DC
  Administered 2021-10-21 – 2021-10-22 (×6): 600 mg via ORAL
  Filled 2021-10-20 (×7): qty 1

## 2021-10-20 MED ORDER — FENTANYL-BUPIVACAINE-NACL 0.5-0.125-0.9 MG/250ML-% EP SOLN
12.0000 mL/h | EPIDURAL | Status: DC | PRN
  Administered 2021-10-20: 12 mL/h via EPIDURAL

## 2021-10-20 MED ORDER — PRENATAL MULTIVITAMIN CH: 1.0000 | ORAL_TABLET | Freq: Every day | ORAL | Status: DC

## 2021-10-20 MED ORDER — LACTATED RINGERS IV SOLN: INTRAVENOUS | Status: DC

## 2021-10-20 MED ORDER — OXYTOCIN 10 UNIT/ML IJ SOLN
INTRAMUSCULAR | Status: AC
  Filled 2021-10-20: qty 2

## 2021-10-20 MED ORDER — ZOLPIDEM TARTRATE 5 MG PO TABS: 5.0000 mg | ORAL_TABLET | Freq: Every evening | ORAL | Status: DC | PRN

## 2021-10-20 MED ORDER — OXYTOCIN-SODIUM CHLORIDE 30-0.9 UT/500ML-% IV SOLN
1.0000 m[IU]/min | INTRAVENOUS | Status: DC
  Administered 2021-10-20: 2 m[IU]/min via INTRAVENOUS
  Filled 2021-10-20: qty 1000

## 2021-10-20 MED ORDER — AMMONIA AROMATIC IN INHA
RESPIRATORY_TRACT | Status: AC
  Filled 2021-10-20: qty 10

## 2021-10-20 NOTE — Progress Notes (Signed)
Intrapartum Progress Note  S: Patient without major complaints. Had headache earlier that resolved with Tylenol. Not feeling her contractions much.   O: . Vitals:   10/20/21 0708 10/20/21 1049 10/20/21 1149 10/20/21 1230  BP: (!) 142/88 (!) 152/86 (!) 154/100   Pulse: 91 92    Resp: 18     Temp: 98.3 F (36.8 C)   98.7 F (37.1 C)  TempSrc: Oral   Oral  Weight:      Height:         Gen App: NAD, comfortable.  Abdomen: soft, gravid FHT: baseline 150 bpm.  Accels present.  Decels absent. moderate in degree variability.   Tocometer: contractions irregular, q 1-4 minutes Cervix: 3.5/50-60/-3. Foley bulb expelled around 1050 a.m.  Extremities: Nontender, no edema.  Pitocin: 10 mIU  Labs:  Results for orders placed or performed during the hospital encounter of 10/20/21  Protein / creatinine ratio, urine  Result Value Ref Range   Creatinine, Urine 43 mg/dL   Total Protein, Urine 18 mg/dL   Protein Creatinine Ratio 0.42 (H) 0.00 - 0.15 mg/mg[Cre]  Glucose, capillary  Result Value Ref Range   Glucose-Capillary 101 (H) 70 - 99 mg/dL  Glucose, capillary  Result Value Ref Range   Glucose-Capillary 67 (L) 70 - 99 mg/dL     Assessment:  1: SIUP at [redacted]w[redacted]d 2. GDM, uncontrolled with oral medications 3. Pre-eclampsia without severe features  Plan:  1. AROM'd with blood tinged fluid.   2. GDM, continue to monitor blood sugars q 2 hrs. PO solid intake until lunchtime, then after this will reduce to clears.  3. Pre-eclampsia, mild - continue to monitor. Can treat with procardia for severe range BPs, and will start Magmesium sulfate if becomes severe range.     Hildred Laser, MD 10/20/2021 12:46 PM

## 2021-10-20 NOTE — Anesthesia Procedure Notes (Signed)
Epidural Patient location during procedure: OB Start time: 10/20/2021 4:55 PM End time: 10/20/2021 5:15 PM  Staffing Resident/CRNA: Junious Silk, CRNA Performed: resident/CRNA   Preanesthetic Checklist Completed: patient identified, IV checked, site marked, risks and benefits discussed, surgical consent, monitors and equipment checked, pre-op evaluation and timeout performed  Epidural Patient position: sitting Prep: Betadine Patient monitoring: heart rate, continuous pulse ox and blood pressure Approach: midline Location: L3-L4 Injection technique: LOR air  Needle:  Needle type: Tuohy  Needle gauge: 17 G Needle length: 9 cm and 9 Catheter type: closed end flexible Catheter size: 20 Guage Test dose: negative and 1.5% lidocaine with Epi 1:200 K  Assessment Sensory level: T10 Events: blood not aspirated, injection not painful, no injection resistance, no paresthesia and negative IV test  Additional Notes   Patient tolerated the insertion well without complications.Reason for block:procedure for pain

## 2021-10-20 NOTE — Anesthesia Preprocedure Evaluation (Signed)
Anesthesia Evaluation  Patient identified by MRN, date of birth, ID band Patient awake    Reviewed: Allergy & Precautions, H&P , NPO status , Patient's Chart, lab work & pertinent test results  History of Anesthesia Complications Negative for: history of anesthetic complications  Airway Mallampati: II  TM Distance: >3 FB Neck ROM: full    Dental no notable dental hx.    Pulmonary neg pulmonary ROS,    Pulmonary exam normal        Cardiovascular negative cardio ROS Normal cardiovascular exam     Neuro/Psych negative neurological ROS  negative psych ROS   GI/Hepatic negative GI ROS, Neg liver ROS,   Endo/Other  negative endocrine ROSdiabetes  Renal/GU negative Renal ROS  negative genitourinary   Musculoskeletal   Abdominal   Peds  Hematology negative hematology ROS (+)   Anesthesia Other Findings   Reproductive/Obstetrics (+) Pregnancy                             Anesthesia Physical Anesthesia Plan  ASA: 3  Anesthesia Plan:    Post-op Pain Management: Epidural   Induction:   PONV Risk Score and Plan:   Airway Management Planned:   Additional Equipment:   Intra-op Plan:   Post-operative Plan:   Informed Consent: I have reviewed the patients History and Physical, chart, labs and discussed the procedure including the risks, benefits and alternatives for the proposed anesthesia with the patient or authorized representative who has indicated his/her understanding and acceptance.       Plan Discussed with: Anesthesiologist and CRNA  Anesthesia Plan Comments:         Anesthesia Quick Evaluation

## 2021-10-20 NOTE — H&P (Signed)
Obstetric History and Physical  Jordan Mathis is a 36 y.o. [id number] with IUP at [redacted]w[redacted]d presenting for scheduled IOL for uncontrolled GDM (on Glyburide) due to non-compliance and pre-eclampsia without severe features. Patient also with prior h/o C-section x 1 desiring trial of labor. Patient states she has been having  irregular non-painful contractions,  no  vaginal bleeding, intact membranes, with active fetal movement.    Prenatal Course Source of Care: [facility] with onset of care at 15 weeks (transfer of care from [facility]) Pregnancy complications or risks: Patient Active Problem List   Diagnosis Date Noted   Gestational diabetes mellitus (GDM) controlled on oral hypoglycemic drug 10/20/2021   Hypertension affecting pregnancy in third trimester 10/16/2021   Mild pre-eclampsia 10/15/2021   Echogenic intracardiac focus of fetus on prenatal ultrasound 07/08/2021   History of macrosomia in infant in prior pregnancy, currently pregnant 07/08/2021   Desires VBAC (vaginal birth after cesarean) trial 07/08/2021   History of gestational diabetes 07/08/2021   History of pre-eclampsia 07/08/2021   Pregnancy, supervision, high-risk, second trimester 07/08/2021   Influenza vaccination declined 07/08/2021   She plans to breastfeed She desires  unsure method  for postpartum contraception.   Prenatal labs and studies: ABO, Rh: --/--/B POS (01/03 0654) Antibody: NEG (01/03 0654) Rubella:  Reviewed in Care Everywhere RPR: Non Reactive (10/19 0947)  HBsAg:   Reviewed in Care Everywhere HIV:  Reviewed in [facility] GBS:--/Negative (12/19 1314) 1 hr Glucola  abnormal, 167.  Three hour GTT with 2 values elevated Genetic screening normal Anatomy US normal   Past Medical History:  Diagnosis Date   Chlamydia 03/2021   GERD (gastroesophageal reflux disease)    Gestational diabetes    History of anemia    History of C-section    History of gestational diabetes  mellitus (GDM)    History of pre-eclampsia    History of urinary tract infection     Past Surgical History:  Procedure Laterality Date   CESAREAN SECTION  10/02/2014    OB History  Gravida Para Term Preterm AB Living  4 2 2  0 1 2  SAB IAB Ectopic Multiple Live Births  0 1 0   2    # Outcome Date GA Lbr Len/2nd Weight Sex Delivery Anes PTL Lv  4 Current           3 Term 10/02/14 [redacted]w[redacted]d  3912 g F CS-Unspec   LIV     Birth Comments: [facility], C-section due to infant size     Complications: Gestational diabetes mellitus  2 Term 06/24/12 [redacted]w[redacted]d  3345 g F Vag-Spont   LIV     Birth Comments: [facility]     Complications: Pre-eclampsia, third trimester  1 IAB             Social History   Socioeconomic History   Marital status: Unknown    Spouse name: Not on file   Number of children: 2   Years of education: 13 (currently enrolled at [employer] in Nursing Courses)   Highest education level: High school graduate  Occupational History   Occupation: Nurse    Comment: Midwife  Tobacco Use   Smoking status: Never   Smokeless tobacco: Never  Vaping Use   Vaping Use: Never used  Substance and Sexual Activity   Alcohol use: Never    Comment: Last ETOH use end of 05/2020.   Drug use: Never   Sexual activity: Yes    Comment: Undecided  Other  Topics Concern   Not on file  Social History Narrative   ** Merged History Encounter **       Social Determinants of Health   Financial Resource Strain: Not on file  Food Insecurity: Not on file  Transportation Needs: Not on file  Physical Activity: Not on file  Stress: Not on file  Social Connections: Not on file    Family History  Problem Relation Age of Onset   Hypertension Mother 47   Hypertension Father    Colon cancer Maternal Grandmother     Medications Prior to Admission  Medication Sig Dispense Refill Last Dose   glyBURIDE (DIABETA) 2.5 MG tablet Take 1 tablet (2.5 mg total) by mouth daily. Take with dinner. 60 tablet 0 10/19/2021    Prenatal Vit-Fe Fumarate-FA (MULTIVITAMIN-PRENATAL) 27-0.8 MG TABS tablet Take 1 tablet by mouth daily at 12 noon. 30 tablet 11 10/19/2021   Accu-Chek Softclix Lancets lancets Use one lancet four times daily 200 each 12    glucose blood test strip Use one test strip four times daily 200 each 12     Allergies  Allergen Reactions   Amoxicillin Hives   Penicillins Hives   Penicillins Hives    Review of Systems: Negative except for what is mentioned in HPI.  Physical Exam: BP (!) 142/88 (BP Location: Right Arm)    Pulse 91    Temp 98.3 F (36.8 C) (Oral)    Resp 18    Ht 5\' 3"  (1.6 m)    Wt 93.9 kg    LMP 01/25/2021 (Exact Date)    BMI 36.67 kg/m  CONSTITUTIONAL: Well-developed, well-nourished female in no acute distress.  HENT:  Normocephalic, atraumatic, External right and left ear normal. Oropharynx is clear and moist EYES: Conjunctivae and EOM are normal. Pupils are equal, round, and reactive to light. No scleral icterus.  NECK: Normal range of motion, supple, no masses SKIN: Skin is warm and dry. No rash noted. Not diaphoretic. No erythema. No pallor. NEUROLOGIC: Alert and oriented to person, place, and time. Normal reflexes, muscle tone coordination. No cranial nerve deficit noted. PSYCHIATRIC: Normal mood and affect. Normal behavior. Normal judgment and thought content. CARDIOVASCULAR: Normal heart rate noted, regular rhythm RESPIRATORY: Effort and breath sounds normal, no problems with respiration noted ABDOMEN: Soft, nontender, nondistended, gravid. MUSCULOSKELETAL: Normal range of motion. No edema and no tenderness. 2+ distal pulses.  Cervical Exam: Dilatation 0.5-1 cm   Effacement thick%   Station -3   Presentation: cephalic FHT:  Baseline rate 135 bpm   Variability moderate  Accelerations present   Decelerations none Contractions: Irregular, q 2-7 min    Pertinent Labs/Studies:   Results for orders placed or performed during the hospital encounter of 10/20/21 (from the  past 24 hour(s))  CBC     Status: Abnormal   Collection Time: 10/20/21  6:54 AM  Result Value Ref Range   WBC 6.5 4.0 - 10.5 K/uL   RBC 3.83 (L) 3.87 - 5.11 MIL/uL   Hemoglobin 10.2 (L) 12.0 - 15.0 g/dL   HCT 31.3 (L) 36.0 - 46.0 %   MCV 81.7 80.0 - 100.0 fL   MCH 26.6 26.0 - 34.0 pg   MCHC 32.6 30.0 - 36.0 g/dL   RDW 14.6 11.5 - 15.5 %   Platelets 255 150 - 400 K/uL   nRBC 0.3 (H) 0.0 - 0.2 %  Type and screen     Status: None   Collection Time: 10/20/21  6:54 AM  Result Value  Ref Range   ABO/RH(D) B POS    Antibody Screen NEG    Sample Expiration      10/23/2021,2359 Performed at [facility], [address] [place], [place] [address]     Assessment : [id number] Jordan Mathis is a 36 y.o. [id number] at [redacted]w[redacted]d being admitted for induction of labor due to GDM (uncontrolled on Glyburide), pre-eclampsia without severe features, h/o C/S x 1 desiring TOLAC.  Plan: Labor: Induction with foley bulb and low dose Pitocin as ordered as per protocol. Analgesia as needed. Monitor CBGs q 2 hours, if wnl, can space to q 4 hrs.  FWB: Reassuring fetal heart tracing.  GBS negative Delivery plan: Hopeful for vaginal delivery    Jordan Maid, MD [facility]

## 2021-10-20 NOTE — Progress Notes (Signed)
Intrapartum Progress Note  S: Patient denies complaints.  Is s/p epidural placement.   O: . Vitals:   10/20/21 1430 10/20/21 1450 10/20/21 1555 10/20/21 1655  BP:  (!) 150/99 (!) 144/95 (!) 158/87  Pulse:   81   Resp:      Temp: 98.9 F (37.2 C)     TempSrc: Oral     Weight:      Height:         Gen App: NAD, comfortable.  Abdomen: soft, gravid FHT: baseline 150 bpm.  Accels present.  Decels absent. moderate in degree variability.   Tocometer: contractions irregular, q 1-3 minutes Cervix: 6/80/0.  Extremities: Nontender, no edema.  Pitocin: 20 mIU  Labs:   Latest Reference Range & Units 10/20/21 12:12 10/20/21 14:47 10/20/21 17:24  Glucose-Capillary 70 - 99 mg/dL 67 (L) 97 68 (L)  (L): Data is abnormally low  Assessment:  1: SIUP at [redacted]w[redacted]d 2. GDM, uncontrolled with oral medications 3. Pre-eclampsia without severe features  Plan:  1. AROM'd with blood tinged fluid.   2. GDM, can space to q 4 hours.  3. Pre-eclampsia, mild - continue to monitor. Can treat with procardia for severe range BPs, and will start Magmesium sulfate if becomes severe range.     Hildred Laser, MD 10/20/2021 6:38 PM

## 2021-10-20 NOTE — Progress Notes (Signed)
CBG 68. Apple Juice given- patient tolerated and asymptomatic at this time.

## 2021-10-21 ENCOUNTER — Encounter: Payer: Self-pay | Admitting: Obstetrics and Gynecology

## 2021-10-21 LAB — RPR: RPR Ser Ql: NONREACTIVE

## 2021-10-21 LAB — CBC
HCT: 30.4 % — ABNORMAL LOW (ref 36.0–46.0)
Hemoglobin: 9.6 g/dL — ABNORMAL LOW (ref 12.0–15.0)
MCH: 25.9 pg — ABNORMAL LOW (ref 26.0–34.0)
MCHC: 31.6 g/dL (ref 30.0–36.0)
MCV: 82.2 fL (ref 80.0–100.0)
Platelets: 229 10*3/uL (ref 150–400)
RBC: 3.7 MIL/uL — ABNORMAL LOW (ref 3.87–5.11)
RDW: 14.7 % (ref 11.5–15.5)
WBC: 11.2 10*3/uL — ABNORMAL HIGH (ref 4.0–10.5)
nRBC: 0 % (ref 0.0–0.2)

## 2021-10-21 LAB — GLUCOSE, RANDOM: Glucose, Bld: 91 mg/dL (ref 70–99)

## 2021-10-21 MED ORDER — NIFEDIPINE ER OSMOTIC RELEASE 30 MG PO TB24
30.0000 mg | ORAL_TABLET | Freq: Two times a day (BID) | ORAL | Status: DC
  Administered 2021-10-21 – 2021-10-22 (×3): 30 mg via ORAL
  Filled 2021-10-21 (×3): qty 1

## 2021-10-21 NOTE — Progress Notes (Signed)
Post Partum Day # 1, s/p SVD  Subjective: no complaints, up ad lib, voiding, and tolerating PO  Objective: Temp:  [98.4 F (36.9 C)-98.9 F (37.2 C)] 98.5 F (36.9 C) (01/04 0414) Pulse Rate:  [81-99] 81 (01/04 0414) Resp:  [16-20] 18 (01/04 0414) BP: (133-160)/(71-100) 146/92 (01/04 0414) SpO2:  [98 %-100 %] 99 % (01/04 0414)  Physical Exam:  General: alert and no distress  Lungs: clear to auscultation bilaterally Breasts: normal appearance, no masses or tenderness Heart: regular rate and rhythm, S1, S2 normal, no murmur, click, rub or gallop Abdomen: soft, non-tender; bowel sounds normal; no masses,  no organomegaly Pelvis: Lochia: appropriate, Uterine Fundus: firm Extremities: DVT Evaluation: No evidence of DVT seen on physical exam. Negative Homan's sign. No cords or calf tenderness. No significant calf/ankle edema.  Recent Labs    10/20/21 0654 10/21/21 0601  HGB 10.2* 9.6*  HCT 31.3* 30.4*    Latest Reference Range & Units 10/21/21 06:01  Glucose 70 - 99 mg/dL 91    Assessment/Plan: Doing well postpartum Breastfeeding, Lactation consult Circumcision prior to discharge Contraception Nexplanon Mild pre-eclampsia (no severe features), will treat continued elevated BPs with Procardia postpartum.  GDM, previously on Glyburide. No further need for medications at this time.     LOS: 1 day   Hildred Laser, MD [facility] 10/21/2021 8:21 AM

## 2021-10-21 NOTE — Lactation Note (Signed)
This note was copied from a baby's chart. Lactation Consultation Note  Patient Name: Jordan Mathis [id number] Date: 10/21/2021   Age:34 hours  LC in room to check-in with mom. Mom doing well, pumping routinely with her pump and plans to provide EBM. Her current plan is to provide formula until her volume matches baby's demand.  Mom has felt that her questions have been answered today and she is supported with her feeding choice. Mom knows that she can call out with any concerns.    [name] 10/21/2021, 3:31 PM

## 2021-10-21 NOTE — Lactation Note (Signed)
This note was copied from a baby's chart. Lactation Consultation Note  Patient Name: Boy Vayah Blot [id number] Date: 10/21/2021 Reason for consult: Initial assessment Age:34 hours   P3 vaginal delivery 13 hours ago. MOB intends to bottle feed with EBM since needing to return to work. MOB pumped with previous children for 1 year.   Mom expressed concern for engorgement since she believes that led to her not being able to nurse previously. Went over hand expression, ice and ibuprofen if needed. Instructed pumping 8 or more times in a 24 hour period to stimulate tissue. Told mom about the first 24 hours with baby and cluster feeding patterns.   14mm flange continues to work well for mom. Mom had no further questions.   Maternal Data Has patient been taught Hand Expression?: Yes Does the patient have breastfeeding experience prior to this delivery?: Yes How long did the patient breastfeed?: Exclusively pumped for 1 year  Feeding Mother's Current Feeding Choice: Breast Milk     Interventions Interventions: Hand express;Expressed milk;Pace feeding;DEBP;Ice  Discharge Pump: DEBP;Personal  Consult Status Consult Status: PRN    [name] 10/21/2021, 10:04 AM

## 2021-10-21 NOTE — Anesthesia Postprocedure Evaluation (Signed)
Anesthesia Post Note  Patient: Jordan Mathis  Procedure(s) Performed: AN AD Cooper  Patient location during evaluation: Mother Baby Anesthesia Type: Epidural Level of consciousness: awake and alert Pain management: pain level controlled Vital Signs Assessment: post-procedure vital signs reviewed and stable Respiratory status: spontaneous breathing, nonlabored ventilation and respiratory function stable Cardiovascular status: stable Postop Assessment: no headache, no backache and epidural receding Anesthetic complications: no   No notable events documented.   Last Vitals:  Vitals:   10/21/21 0414 10/21/21 0828  BP: (!) 146/92 136/90  Pulse: 81 85  Resp: 18 18  Temp: 36.9 C 36.9 C  SpO2: 99% 99%    Last Pain:  Vitals:   10/21/21 0828  TempSrc: Oral  PainSc:                  Caryl Asp

## 2021-10-22 ENCOUNTER — Telehealth: Payer: Self-pay | Admitting: Obstetrics and Gynecology

## 2021-10-22 MED ORDER — OXYCODONE HCL 5 MG PO TABS
10.0000 mg | ORAL_TABLET | Freq: Once | ORAL | Status: AC
  Administered 2021-10-22: 5 mg via ORAL
  Filled 2021-10-22: qty 2

## 2021-10-22 MED ORDER — IBUPROFEN 600 MG PO TABS: 600.0000 mg | ORAL_TABLET | Freq: Four times a day (QID) | ORAL | 1 refills | Status: DC | PRN

## 2021-10-22 MED ORDER — AMLODIPINE BESYLATE 5 MG PO TABS: 5.0000 mg | ORAL_TABLET | Freq: Every day | ORAL | 0 refills | Status: DC

## 2021-10-22 MED ORDER — NIFEDIPINE ER 30 MG PO TB24: 30.0000 mg | ORAL_TABLET | Freq: Two times a day (BID) | ORAL | 0 refills | Status: DC

## 2021-10-22 NOTE — Telephone Encounter (Signed)
Pt is calling in stating that she is still having real bad headaches and would like to see if Dr. Valentino Saxon could send something else in due to the other medication is not touching the headache.  Pt would like to have a call back.

## 2021-10-22 NOTE — Telephone Encounter (Signed)
Please inform patient that I think it may be the blood pressure medication that I put her on (Procardia). She can discontinue it and I will send in something different (Norvasc).

## 2021-10-22 NOTE — Discharge Summary (Signed)
Postpartum Discharge Summary      Patient Name: Jordan Mathis DOB: 1986/10/11 MRN: [id number]  Date of admission: 10/20/2021 Delivery date:10/20/2021  Delivering provider: Rubie Maid  Date of discharge: 10/22/2021  Admitting diagnosis: Gestational diabetes mellitus (GDM) controlled on oral hypoglycemic drug [O24.415] Intrauterine pregnancy: [redacted]w[redacted]d    Secondary diagnosis:  Principal Problem:   Gestational diabetes mellitus (GDM) controlled on oral hypoglycemic drug Active Problems:   History of macrosomia in infant in prior pregnancy, currently pregnant   Desires VBAC (vaginal birth after cesarean) trial   Pregnancy, supervision, high-risk, second trimester   Mild pre-eclampsia  Additional problems: None    Discharge diagnosis: Term Pregnancy Delivered, Preeclampsia (mild), and GDM A2                                              Post partum procedures: None Augmentation: AROM, Pitocin, and IP Foley Complications: None  Hospital course: Induction of Labor With Vaginal Delivery   36y.o. yo [id number] admitted to the hospital 10/20/2021 for induction of labor.  Indication for induction: Preeclampsia and A2 DM.  Patient had an uncomplicated labor course as follows: Membrane Rupture Time/Date: 12:12 PM ,10/20/2021   Delivery Method:Vaginal, Spontaneous  Episiotomy: None  Lacerations:  1st degree  Details of delivery can be found in separate delivery note.  Patient had a routine postpartum course. Patient is discharged home 10/22/21.  Newborn Data: Birth date:10/20/2021  Birth time:8:10 PM  Gender:Female  Living status:Living  Apgars:8 ,9  Weight:3200 g   Magnesium Sulfate received: No BMZ received: No Rhophylac:No MMR:No T-DaP:Given prenatally Flu: No Transfusion:No  Physical exam  Vitals:   10/21/21 2331 10/22/21 0340 10/22/21 0756 10/22/21 1125  BP: 124/85 129/78 (!) 138/92 140/90  Pulse: 95 88 92 90  Resp: 16 16 20 18   Temp: 98.4 F (36.9 C)  97.8 F (36.6 C) 98.4 F (36.9 C) 99.1 F (37.3 C)  TempSrc: Oral Oral Oral Oral  SpO2: 99% 98% 98%   Weight:      Height:       General: alert, cooperative, and no distress Lochia: appropriate Uterine Fundus: firm Incision: N/A DVT Evaluation: No evidence of DVT seen on physical exam. Negative Homan's sign. No cords or calf tenderness. No significant calf/ankle edema.   Labs: Lab Results  Component Value Date   WBC 11.2 (H) 10/21/2021   HGB 9.6 (L) 10/21/2021   HCT 30.4 (L) 10/21/2021   MCV 82.2 10/21/2021   PLT 229 10/21/2021   CMP Latest Ref Rng & Units 10/21/2021  Glucose 70 - 99 mg/dL 91  BUN 6 - 20 mg/dL -  Creatinine 0.44 - 1.00 mg/dL -  Sodium 135 - 145 mmol/L -  Potassium 3.5 - 5.1 mmol/L -  Chloride 98 - 111 mmol/L -  CO2 22 - 32 mmol/L -  Calcium 8.9 - 10.3 mg/dL -  Total Protein 6.5 - 8.1 g/dL -  Total Bilirubin 0.3 - 1.2 mg/dL -  Alkaline Phos 38 - 126 U/L -  AST 15 - 41 U/L -  ALT 0 - 44 U/L -   Edinburgh Score: Edinburgh Postnatal Depression Scale Screening Tool 10/21/2021  I have been able to laugh and see the funny side of things. 0  I have looked forward with enjoyment to things. 0  I have blamed myself unnecessarily when  things went wrong. 0  I have been anxious or worried for no good reason. 1  I have felt scared or panicky for no good reason. 0  Things have been getting on top of me. 0  I have been so unhappy that I have had difficulty sleeping. 0  I have felt sad or miserable. 0  I have been so unhappy that I have been crying. 0  The thought of harming myself has occurred to me. 0  Edinburgh Postnatal Depression Scale Total 1      After visit meds:  Allergies as of 10/22/2021       Reactions   Amoxicillin Hives   Penicillins Hives   Penicillins Hives        Medication List     STOP taking these medications    Accu-Chek Softclix Lancets lancets   glucose blood test strip   glyBURIDE 2.5 MG tablet Commonly known as:  DIABETA       TAKE these medications    ibuprofen 600 MG tablet Commonly known as: ADVIL Take 1 tablet (600 mg total) by mouth every 6 (six) hours as needed.   multivitamin-prenatal 27-0.8 MG Tabs tablet Take 1 tablet by mouth daily at 12 noon.         Discharge home in stable condition Infant Feeding: Breast Infant Disposition:home with mother Discharge instruction: per After Visit Summary and Postpartum booklet. Activity: Advance as tolerated. Pelvic rest for 6 weeks.  Diet: routine diet Anticipated Birth Control: Nexplanon Postpartum Appointment:6 weeks Additional Postpartum F/U: Postpartum Depression checkup and BP check 4-5 days Future Appointments:No future appointments. Follow up Visit:  Follow-up Information     Rubie Maid, MD Follow up.   Specialties: Obstetrics and Gynecology, Radiology Why: 4-5 days for BP check 2 week televisit mood check 6 week postpartum visit Contact information: [address] [place] [address] [phone]                     10/22/2021 Rubie Maid, MD

## 2021-10-22 NOTE — Progress Notes (Signed)
Post Partum Day # 2, s/p SVD  Subjective: up ad lib, voiding, and tolerating PO. Patient complains of headache, not relieved with Tylenol. Does not worsen with position changes.   Objective: Temp:  [97.8 F (36.6 C)-99.1 F (37.3 C)] 99.1 F (37.3 C) (01/05 1125) Pulse Rate:  [88-95] 90 (01/05 1125) Resp:  [16-20] 18 (01/05 1125) BP: (124-140)/(78-92) 140/90 (01/05 1125) SpO2:  [98 %-99 %] 98 % (01/05 0756)  Physical Exam:  General: alert and no distress  Lungs: clear to auscultation bilaterally Breasts: normal appearance, no masses or tenderness Heart: regular rate and rhythm, S1, S2 normal, no murmur, click, rub or gallop Abdomen: soft, non-tender; bowel sounds normal; no masses,  no organomegaly Pelvis: Lochia: appropriate, Uterine Fundus: firm Extremities: DVT Evaluation: No evidence of DVT seen on physical exam. Negative Homan's sign. No cords or calf tenderness. No significant calf/ankle edema.  Recent Labs    10/20/21 0654 10/21/21 0601  HGB 10.2* 9.6*  HCT 31.3* 30.4*    Latest Reference Range & Units 10/21/21 06:01  Glucose 70 - 99 mg/dL 91    Assessment/Plan: - Doing well postpartum - Breastfeeding, Lactation consult - Circumcision prior to discharge - Contraception Nexplanon - Mild pre-eclampsia (no prior severe features), started on Procardia yesterday for mild to moderately elevated blood pressures postpartum. Currently with headache. Will prescribe a dose of Oxycodone. If still no relief, may need to consider worsening of pre-eclampsia. Patient also notes that she recently ordered some coffee to help, and has not slept much in the past 2 days. - GDM, previously on Glyburide. No further need for medications at this time.  - Dispo: possible d/c home later today if headache resolves with treatment.     LOS: 2 days   Hildred Laser, MD [facility] 10/22/2021 11:08 PM

## 2021-10-23 ENCOUNTER — Other Ambulatory Visit: Payer: Self-pay | Admitting: Obstetrics and Gynecology

## 2021-10-23 NOTE — Telephone Encounter (Signed)
LM with patient

## 2021-10-27 ENCOUNTER — Telehealth: Payer: Self-pay | Admitting: Obstetrics and Gynecology

## 2021-10-27 NOTE — Telephone Encounter (Signed)
Jordan Mathis with [facility] Department called states that she spoke with pt today - b/p is 149/96 and she has had a daily headache since 1/6 that pt is rating 6 out of 10 on pain scale. Pt states that she has tried otc pain relievers and only thing that helps is BC powder. Please Advise.

## 2021-10-27 NOTE — Telephone Encounter (Signed)
Thanks, I've already spoken with [facility] about the patient. I'll be seeing her in the morning.

## 2021-10-28 ENCOUNTER — Other Ambulatory Visit: Payer: Self-pay

## 2021-10-28 ENCOUNTER — Ambulatory Visit (INDEPENDENT_AMBULATORY_CARE_PROVIDER_SITE_OTHER): Payer: Medicaid Other | Admitting: Obstetrics

## 2021-10-28 VITALS — BP 147/92 | Ht 63.0 in | Wt 188.2 lb

## 2021-10-28 DIAGNOSIS — I10 Essential (primary) hypertension: Secondary | ICD-10-CM

## 2021-10-28 DIAGNOSIS — O139 Gestational [pregnancy-induced] hypertension without significant proteinuria, unspecified trimester: Secondary | ICD-10-CM | POA: Diagnosis not present

## 2021-10-28 MED ORDER — OXYCODONE-ACETAMINOPHEN 5-325 MG PO TABS: 1.0000 | ORAL_TABLET | Freq: Four times a day (QID) | ORAL | 0 refills | Status: DC | PRN

## 2021-10-28 NOTE — Addendum Note (Signed)
Addended by: Fabian November on: 10/28/2021 12:35 PM   Modules accepted: Orders

## 2021-10-28 NOTE — Progress Notes (Signed)
Reviewed pt's BP and discussed plan with Dr. Marcelline Mates. Jordan Mathis is still having headaches that are relieved with BC powder. She has not started the Norvasc yet. Per Dr. Marcelline Mates, she should start the Norvasc today and return on Monday for a BP check. She will send in rx for pain medication for the headache.  Lloyd Huger, CNM

## 2021-11-02 ENCOUNTER — Ambulatory Visit (INDEPENDENT_AMBULATORY_CARE_PROVIDER_SITE_OTHER): Payer: Medicaid Other | Admitting: Obstetrics and Gynecology

## 2021-11-02 ENCOUNTER — Other Ambulatory Visit: Payer: Self-pay

## 2021-11-02 VITALS — BP 140/91 | HR 84

## 2021-11-02 DIAGNOSIS — O165 Unspecified maternal hypertension, complicating the puerperium: Secondary | ICD-10-CM

## 2021-11-02 DIAGNOSIS — Z0130 Encounter for examination of blood pressure without abnormal findings: Secondary | ICD-10-CM

## 2021-11-03 MED ORDER — AMLODIPINE BESYLATE 10 MG PO TABS: 10.0000 mg | ORAL_TABLET | Freq: Every day | ORAL | 0 refills | Status: DC

## 2021-11-03 NOTE — Progress Notes (Signed)
Patient is here for a blood pressure check. Patient denies chest pain, palpitations, shortness of breath or visual disturbances. At previous visit blood pressure was 147/92. Today during nurse visit first check blood pressure was 146/89 with a heart rate of 76. After resting for 10 minutes it was 140/91 and heart rate was 84. She does take blood pressure medications as prescribed. Consulted with Dr. Valentino Saxon she wants the patient to increase her Amlodipine to 10 mg daily. Called patient. Patient aware. She will double up on medication that she has and I sent in a prescription for 10 mg.

## 2021-11-10 ENCOUNTER — Telehealth: Payer: BC Managed Care – PPO | Admitting: Obstetrics and Gynecology

## 2021-11-11 ENCOUNTER — Telehealth (INDEPENDENT_AMBULATORY_CARE_PROVIDER_SITE_OTHER): Payer: Medicaid Other | Admitting: Obstetrics and Gynecology

## 2021-11-11 ENCOUNTER — Encounter: Payer: Self-pay | Admitting: Obstetrics and Gynecology

## 2021-11-11 VITALS — Ht 63.0 in | Wt 178.0 lb

## 2021-11-11 DIAGNOSIS — Z1332 Encounter for screening for maternal depression: Secondary | ICD-10-CM | POA: Diagnosis not present

## 2021-11-11 DIAGNOSIS — Z8759 Personal history of other complications of pregnancy, childbirth and the puerperium: Secondary | ICD-10-CM

## 2021-11-11 DIAGNOSIS — Z8632 Personal history of gestational diabetes: Secondary | ICD-10-CM

## 2021-11-11 MED ORDER — FUROSEMIDE 20 MG PO TABS: 20.0000 mg | ORAL_TABLET | Freq: Every day | ORAL | 0 refills | Status: DC

## 2021-11-11 NOTE — Patient Instructions (Signed)

## 2021-11-11 NOTE — Progress Notes (Signed)
Virtual Visit via Video Note  I connected with Jordan Mathis on 11/11/21 at  by a telephone enabled telemedicine application and verified that I am speaking with the correct person using two identifiers. Converted from a video visit due to technical issues.   Location: Patient: Home Provider: Office   I discussed the limitations of evaluation and management by telemedicine and the availability of in person appointments. The patient expressed understanding and agreed to proceed.    History of Present Illness:   Jordan Mathis is a 36 y.o. [phone] female who presents for a 2 week televisit for mood check. She is 2 weeks postpartum following a spontaneous vaginal delivery (VBAC).  The delivery was at 57 gestational weeks. Pregnancy was complicated GDM (on Glyburide) and mild pre-eclampsia.  Postpartum course has been well so far. Baby is feeding by breast, pumping every 2-3 hours, ~ 8 oz per breast. Bleeding: light to moderate. Postpartum depression screening: negative.  EDPS score is 2 Checking BPs at home, 120s-140s/90s. Was switched from Procardia to Norvasc ~ 1 week ago due to side effects, currently taking  10 mg.      The following portions of the patient's history were reviewed and updated as appropriate: allergies, current medications, past family history, past medical history, past social history, past surgical history, and problem list.   Observations/Objective:   Height 5\' 3"  (1.6 m), weight 178 lb (80.7 kg), last menstrual period 01/25/2021, currently breastfeeding. Gen App: NAD Psych: normal speech, affect. Good mood.     Edinburgh Postnatal Depression Scale Screening Tool 11/11/2021 10/21/2021 10/21/2021  I have been able to laugh and see the funny side of things. 0 0 (No Data)  I have looked forward with enjoyment to things. 0 0 -  I have blamed myself unnecessarily when things went wrong. 0 0 -  I have been anxious or worried for no good reason. 2 1 -   I have felt scared or panicky for no good reason. 0 0 -  Things have been getting on top of me. 0 0 -  I have been so unhappy that I have had difficulty sleeping. 0 0 -  I have felt sad or miserable. 0 0 -  I have been so unhappy that I have been crying. 0 0 -  The thought of harming myself has occurred to me. 0 0 -  Edinburgh Postnatal Depression Scale Total 2 1 -         Assessment and Plan:   1. Encounter for screening for maternal depression - Screening negative today. Will rescreen at 6 week postpartum visit. Overall doing well.    2. Postpartum state - Overall doing well. Continue routine postpartum home care.    3. Lactating mother - Doing well with breastfeeding, notes some engorgement at times despite adequate output, discussed more frequent or prolonging feeds/pumping, or additional pumping after infant feeds.    4. Mild Pre-eclampsia - patient still noting mildly elevated BPs despite use of Norvasc. Also noting some mild ankle swelling. Will prescribe Lasix 20 mg x 5 days to decrease swelling and help with BPs potentially.   5. GDM, Class A2 - Will f/u postpartum to assess resolution of diabetes.    Follow Up Instructions:     I discussed the assessment and treatment plan with the patient. The patient was provided an opportunity to ask questions and all were answered. The patient agreed with the plan and demonstrated an understanding of the instructions.  The patient was advised to call back or seek an in-person evaluation if the symptoms worsen or if the condition fails to improve as anticipated.   I provided  11 minutes of non-face-to-face time during this encounter.     Rubie Maid, MD [facility]

## 2021-11-13 ENCOUNTER — Other Ambulatory Visit: Payer: Self-pay | Admitting: Obstetrics and Gynecology

## 2021-11-24 ENCOUNTER — Encounter: Payer: BC Managed Care – PPO | Admitting: Obstetrics and Gynecology

## 2021-11-24 ENCOUNTER — Other Ambulatory Visit: Payer: Self-pay

## 2021-11-24 NOTE — Progress Notes (Incomplete)
° °  OBSTETRICS POSTPARTUM CLINIC PROGRESS NOTE  Subjective:     Jordan Mathis is a 36 y.o. [phone] female who presents for a postpartum visit. She is {1-10:13787} {time; units:18646} postpartum following a {delivery:12449}. I have fully reviewed the prenatal and intrapartum course. The delivery was at *** gestational weeks.  Anesthesia: {anesthesia types:812}. Postpartum course has been ***. Baby's course has been ***. Baby is feeding by {breast/bottle:69}. Bleeding: patient {HAS HAS DGL:87564} not resumed menses, with Patient's last menstrual period was 01/25/2021 (exact date).. Bowel function is {normal:32111}. Bladder function is {normal:32111}. Patient {is/is not:9024} sexually active. Contraception method desired is {contraceptive method:5051}. Postpartum depression screening: {neg default:13464::"negative"}.  EDPS score is ***.    The following portions of the patient's history were reviewed and updated as appropriate: allergies, current medications, past family history, past medical history, past social history, past surgical history, and problem list.  Review of Systems {ros; complete:30496}   Objective:    LMP 01/25/2021 (Exact Date)   General:  alert and no distress   Breasts:  inspection negative, no nipple discharge or bleeding, no masses or nodularity palpable  Lungs: clear to auscultation bilaterally  Heart:  regular rate and rhythm, S1, S2 normal, no murmur, click, rub or gallop  Abdomen: soft, non-tender; bowel sounds normal; no masses,  no organomegaly.  ***Well healed Pfannenstiel incision   Vulva:  normal  Vagina: normal vagina, no discharge, exudate, lesion, or erythema  Cervix:  no cervical motion tenderness and no lesions  Corpus: normal size, contour, position, consistency, mobility, non-tender  Adnexa:  normal adnexa and no mass, fullness, tenderness  Rectal Exam: Not performed.         Labs:  Lab Results  Component Value Date   HGB 9.6 (L)  10/21/2021     Assessment:   No diagnosis found.   Plan:    1. Contraception: {method:5051} 2. Will check Hgb for h/o postpartum anemia of less than 10.  3. Follow up in: {1-10:13787} {time; units:19136} or as needed.    Tommie Raymond, CMA [facility]

## 2021-12-02 ENCOUNTER — Encounter: Payer: Self-pay | Admitting: Obstetrics and Gynecology

## 2021-12-16 ENCOUNTER — Encounter: Payer: Medicaid Other | Admitting: Obstetrics and Gynecology

## 2022-01-01 LAB — FETAL NONSTRESS TEST

## 2022-02-03 ENCOUNTER — Other Ambulatory Visit: Payer: Self-pay | Admitting: Obstetrics and Gynecology

## 2022-07-02 IMAGING — US US OB COMP +14 WK
1 series · 15 of 28 positions shown · non-contrast
Comparison: none

CLINICAL DATA: Fetal anatomy evaluation

EXAM:
OBSTETRICAL ULTRASOUND >14 WKS

[Series 1: us ob comp + 14 wk · 15 of 95 slices shown]
[im 1/95]
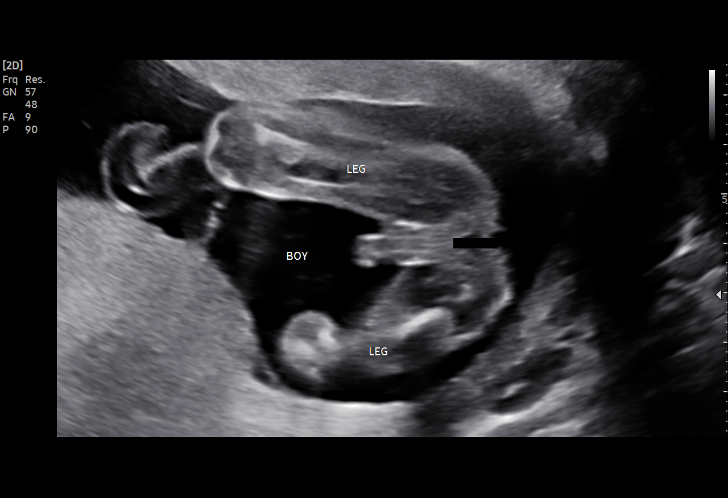
[im 7/95]
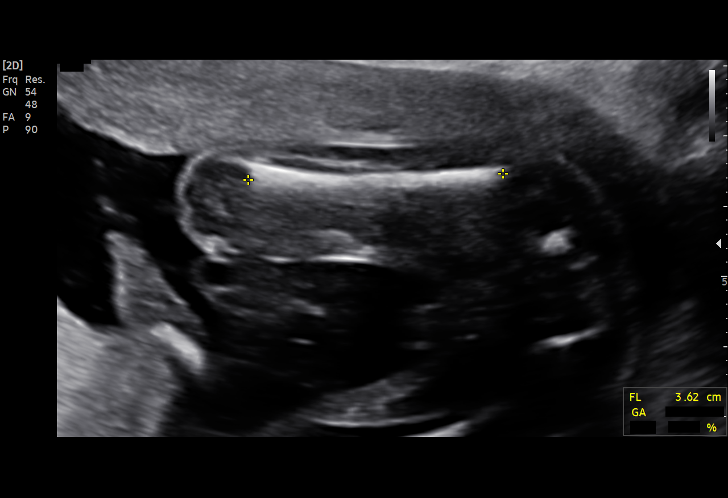
[im 14/95]
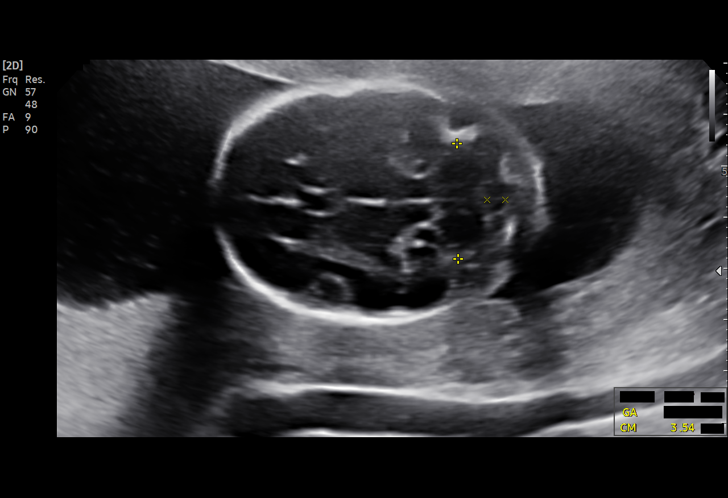
[im 21/95]
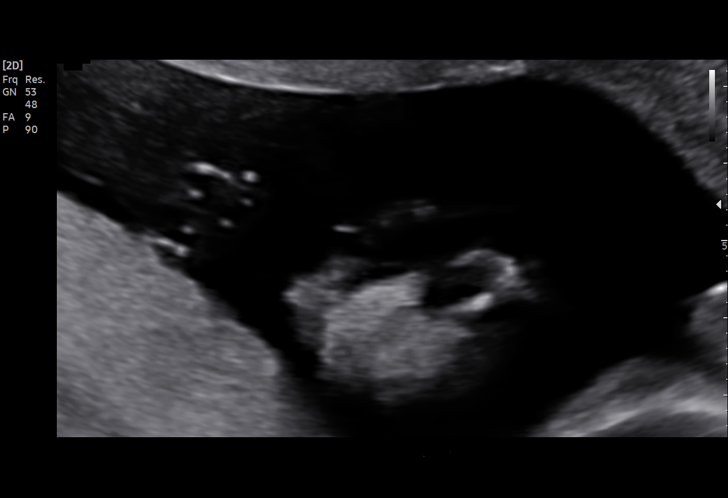
[im 28/95]
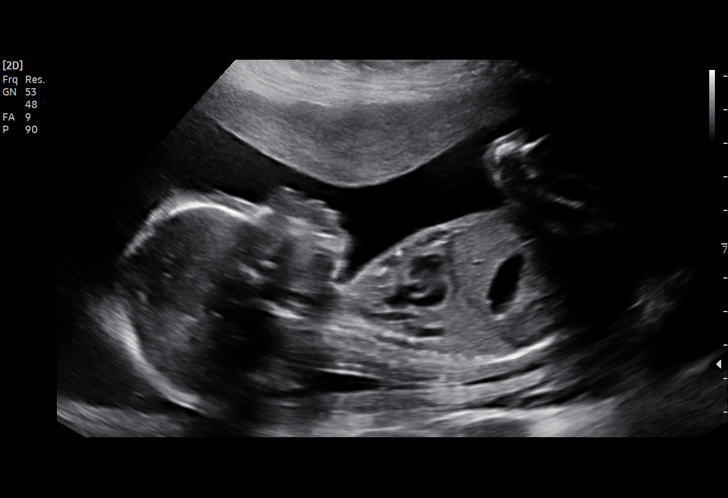
[im 35/95]
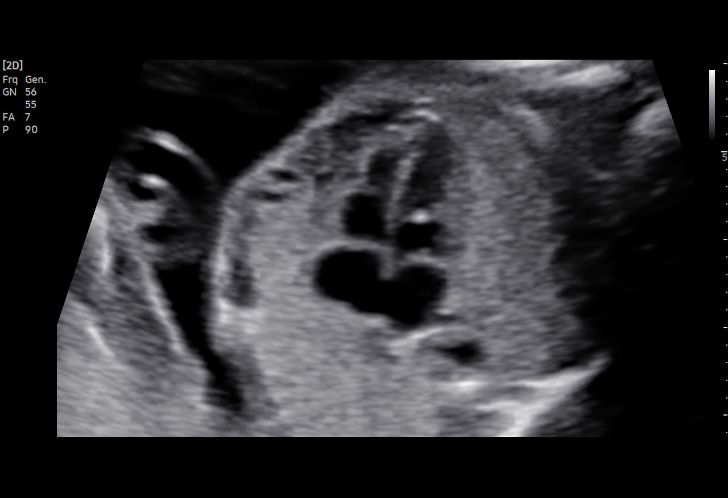
[im 42/95]
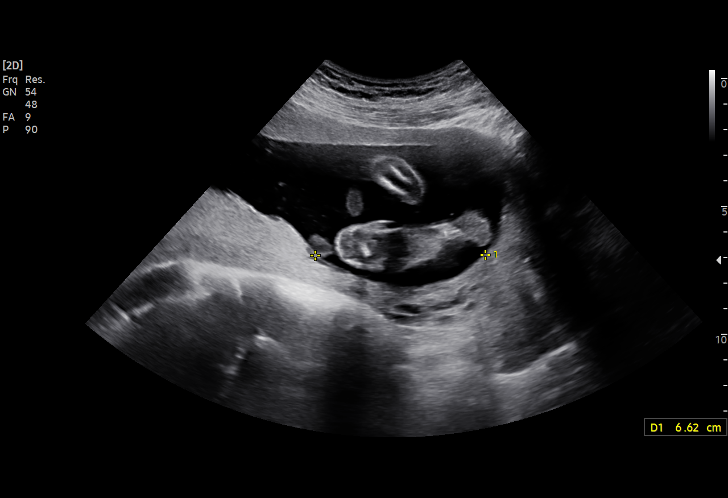
[im 49/95]
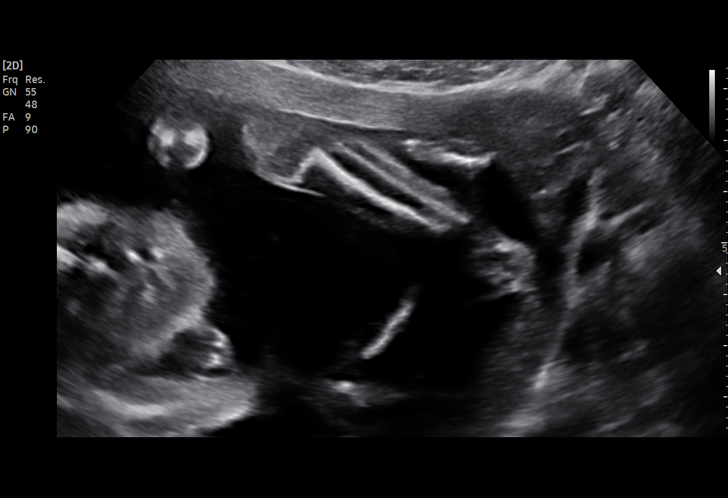
[im 53/95]
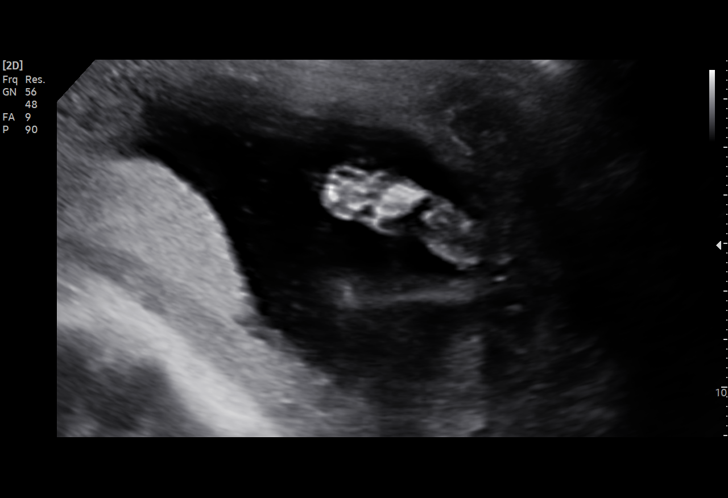
[im 60/95]
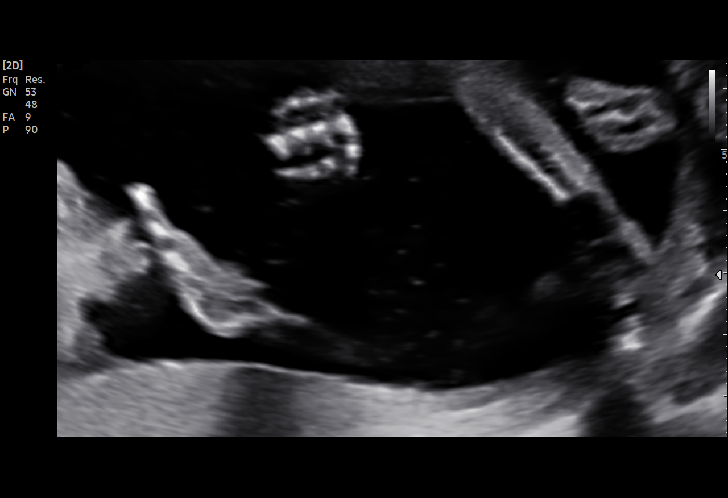
[im 67/95]
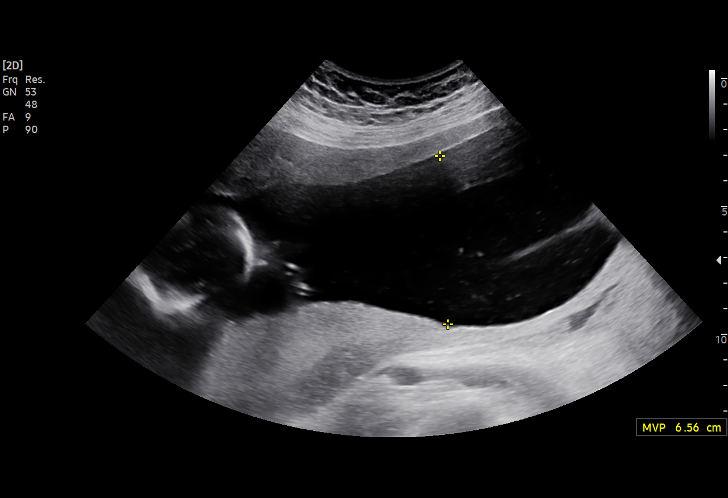
[im 74/95]
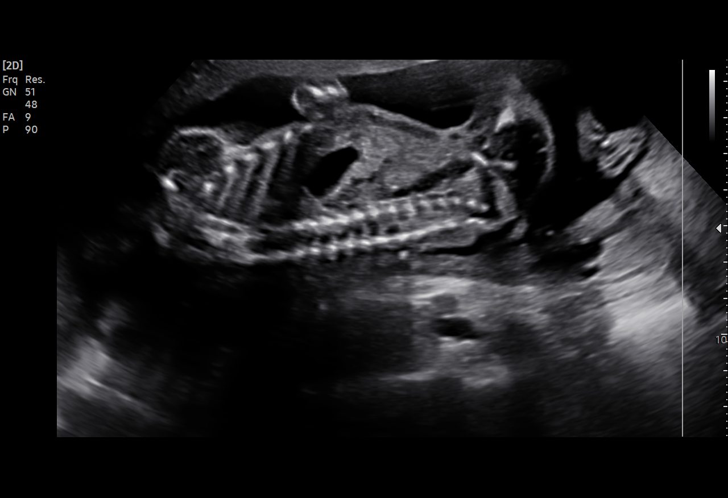
[im 81/95]
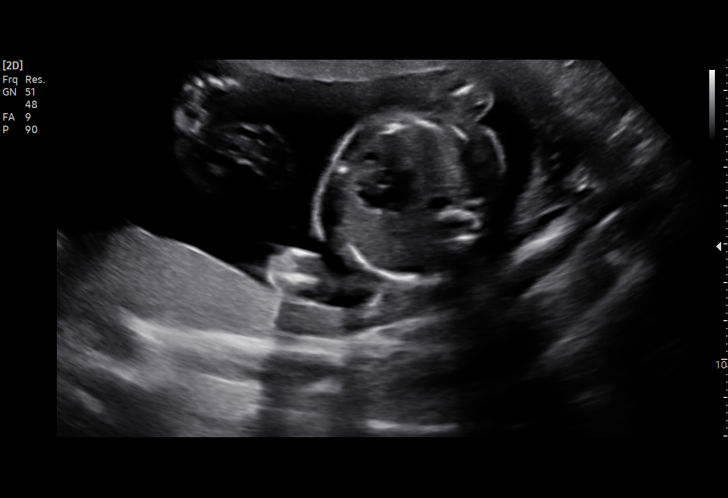
[im 88/95]
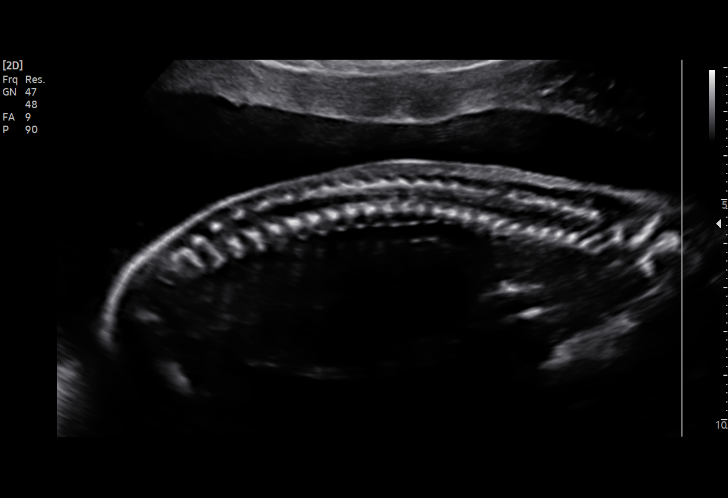
[im 95/95]
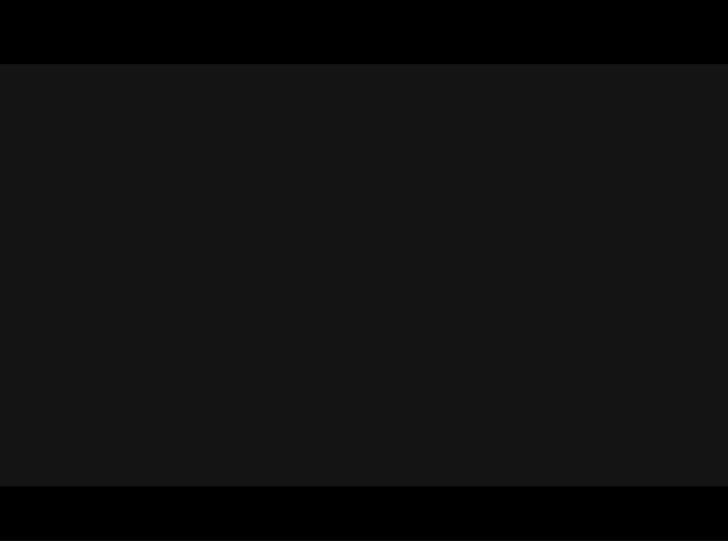

[15 of 28 positions shown; findings below may reference images not displayed]

FINDINGS: Number of Fetuses: 1

Heart Rate:  150 bpm

Movement: Yes

Presentation: Breech

Previa: No

Placental Location: Posterior

Amniotic Fluid (Subjective): Normal

Amniotic Fluid (Objective):

Vertical pocket = 6.56cm

FETAL BIOMETRY

BPD: 4.81cm 20w 4d

HC: 18.29cm  20w   5d

AC: 15.50cm  20w   5d

FL: 3.63cm  21w   4d

Current Mean GA: 21w 2d                     US EDC: 10/28/2021

Assigned GA:  20w 5d            Assigned EDC: 11/01/2021

FETAL ANATOMY

Lateral Ventricles: Appears normal

Thalami/CSP: Appears normal

Posterior Fossa:  Appears normal

Nuchal Region: Appears normal   NFT= N/A > 20 WKS

Upper Lip: Appears normal

Spine: Appears normal

4 Chamber Heart on Left: Appears normal. Incidental note made of an
echogenic intracardiac focus.

LVOT: Appears normal

RVOT: Appears normal

Stomach on Left: Appears normal

3 Vessel Cord: Appears normal

Cord Insertion site: Appears normal

Kidneys: Appears normal

Bladder: Appears normal

Extremities: Appears normal

Sex: Male

Technically difficult due to: None

Maternal Findings:

Cervix:  Cervical length 3.7 cm.  Closed.
IMPRESSION: 1. Single live intrauterine pregnancy as detailed above.
2. No focal fetal anatomic abnormality.
3. Fetal echogenic intracardiac focus noted. This is considered a
soft sonographic marker for Trisomy 21, however, it has a low
specificity for aneuploidy in the absence of associated fetal
abnormalities. Recommend correlation with results of antenatal
screening testing if performed, and consider genetic counseling if
desired.

## 2022-07-27 ENCOUNTER — Encounter: Payer: Self-pay | Admitting: Obstetrics and Gynecology

## 2022-09-21 ENCOUNTER — Ambulatory Visit: Payer: Medicaid Other | Admitting: Obstetrics and Gynecology

## 2022-09-21 NOTE — Progress Notes (Deleted)
    GYNECOLOGY PROGRESS NOTE  Subjective:    Patient ID: [id number] Jordan Mathis, female    DOB: Jan 17, 1986, 36 y.o.   MRN: [id number]  HPI  Patient is a 36 y.o. [phone] female who presents for evaluation of abdominal cramping, heavy menstrual bleeding and spotting in between menstrual cycles.  The following portions of the patient's history were reviewed and updated as appropriate: allergies, current medications, past family history, past medical history, past social history, past surgical history, and problem list.  Review of Systems Pertinent items noted in HPI and remainder of comprehensive ROS otherwise negative.   Objective:   currently breastfeeding. There is no height or weight on file to calculate BMI. General appearance: alert, cooperative, and no distress Abdomen: {abdominal exam:16834} Pelvic: {pelvic exam:16852::"cervix normal in appearance","external genitalia normal","no adnexal masses or tenderness","no cervical motion tenderness","rectovaginal septum normal","uterus normal size, shape, and consistency","vagina normal without discharge"} Extremities: {extremity exam:5109} Neurologic: {neuro exam:17854}   Assessment:   No diagnosis found.   Plan:   There are no diagnoses linked to this encounter.    Hildred Laser, MD [facility]

## 2022-10-19 IMAGING — US US OB FOLLOW-UP
1 series · 13 of 28 positions shown · non-contrast
Comparison: none

CLINICAL DATA: Gestational diabetes. Prior history of fetal
macrosomia. Evaluate growth and amniotic fluid.

EXAM:
OBSTETRIC 14+ WK ULTRASOUND FOLLOW-UP

[Series 1: us ob follow-up · 0.26mm/px · 55 acquisitions, 13 frames shown]
[im 3/55]
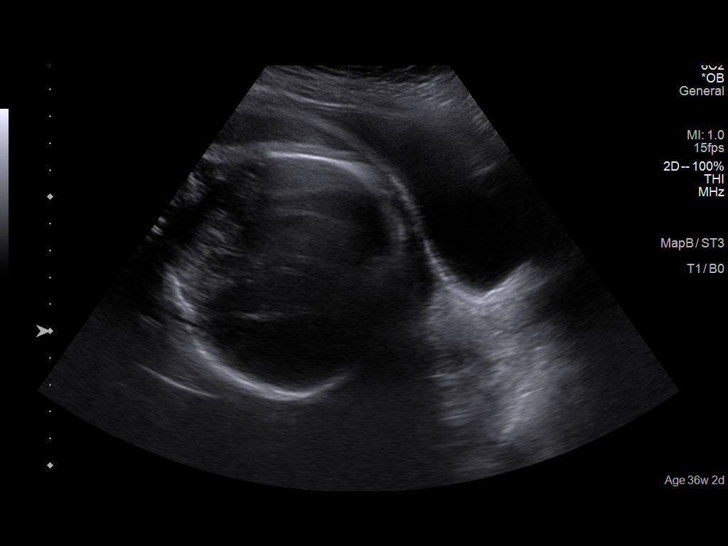
[im 7/55]
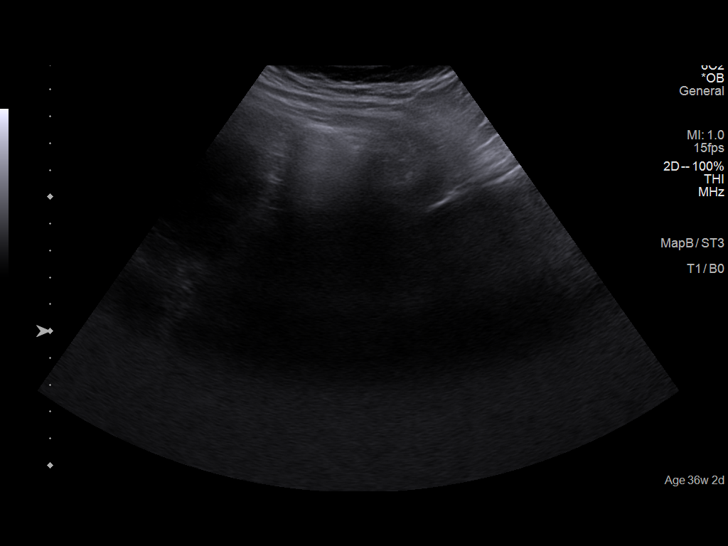
[im 11/55]
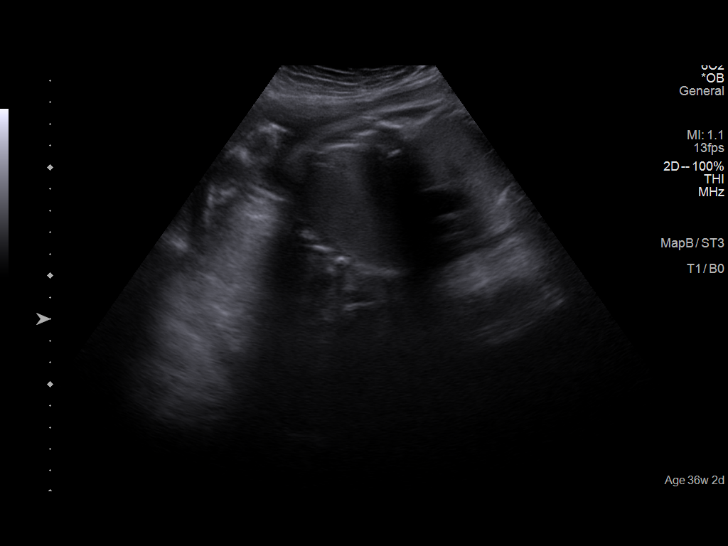
[im 15/55]
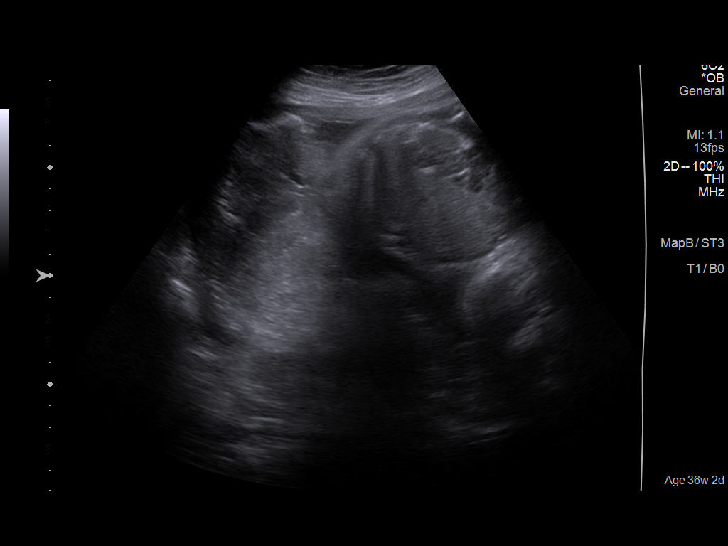
[im 19/55]
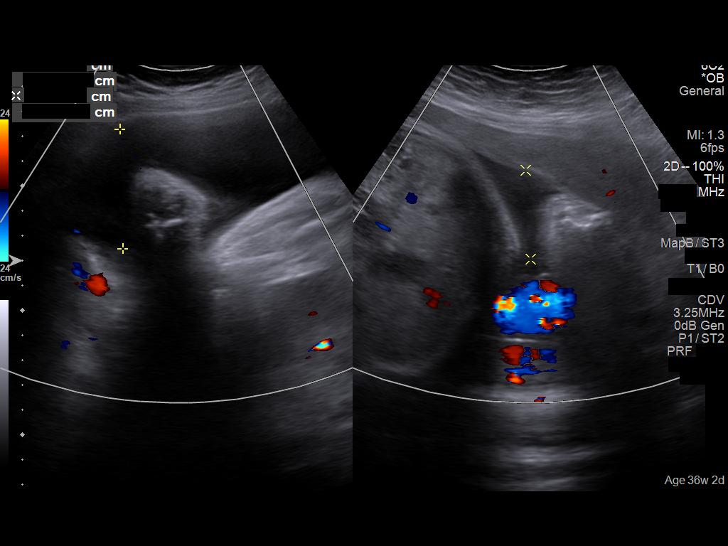
[im 23/55]
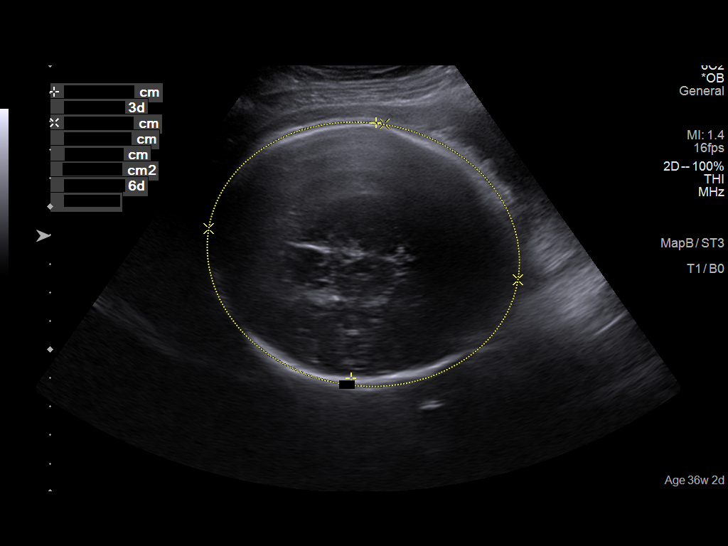
[im 29/55]
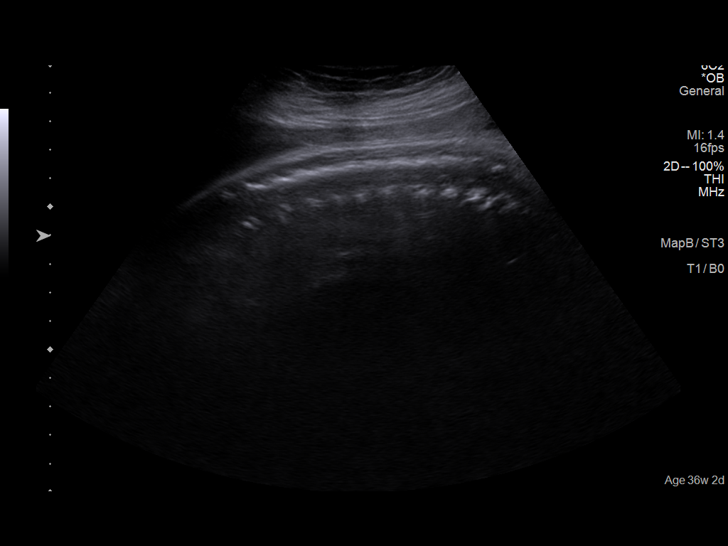
[im 33/55]
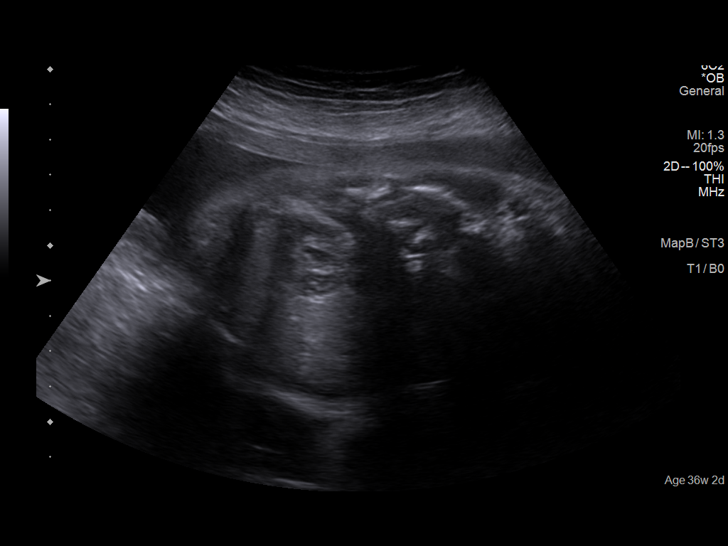
[im 37/55]
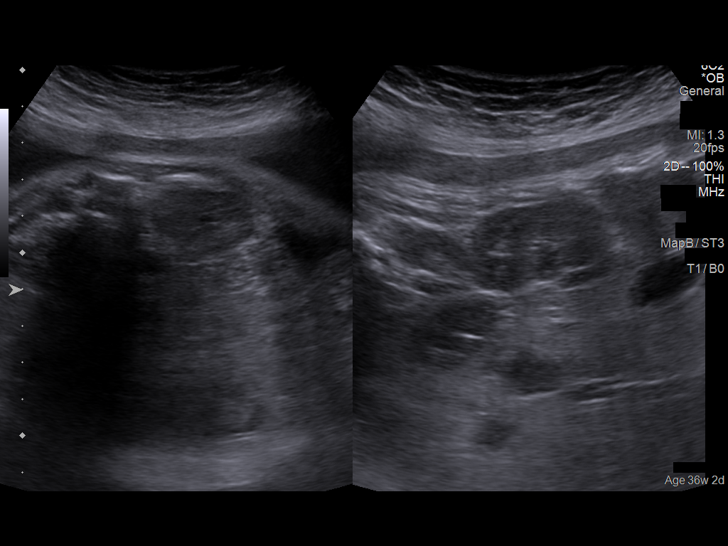
[im 41/55]
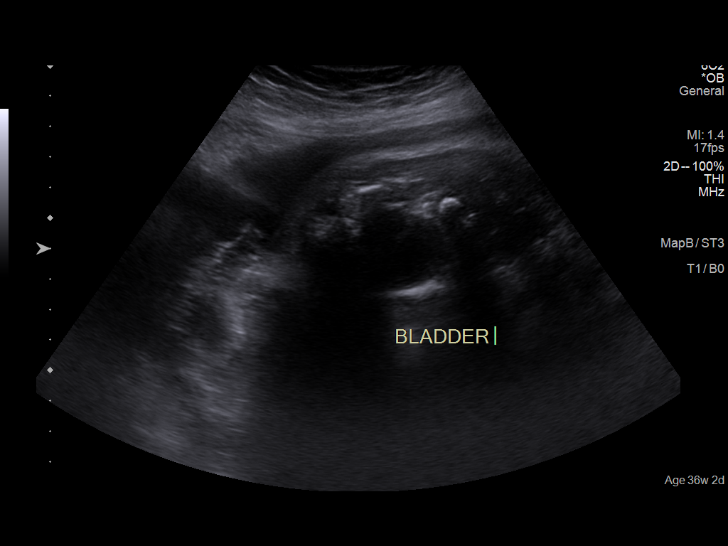
[im 45/55]
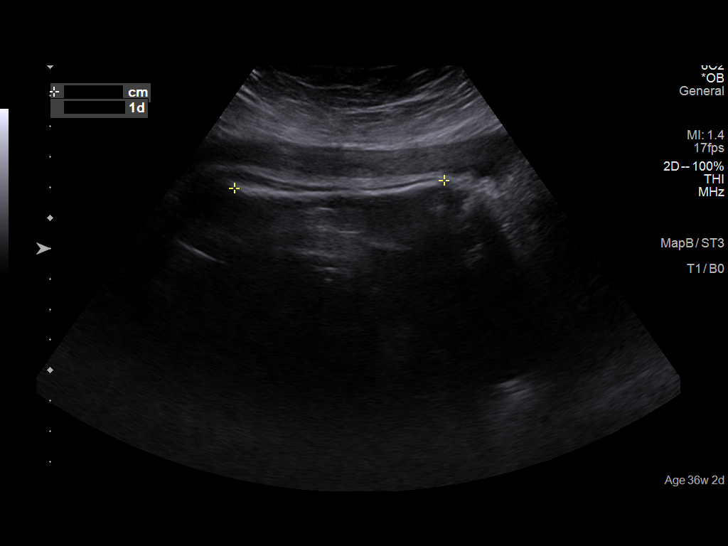
[im 49/55]
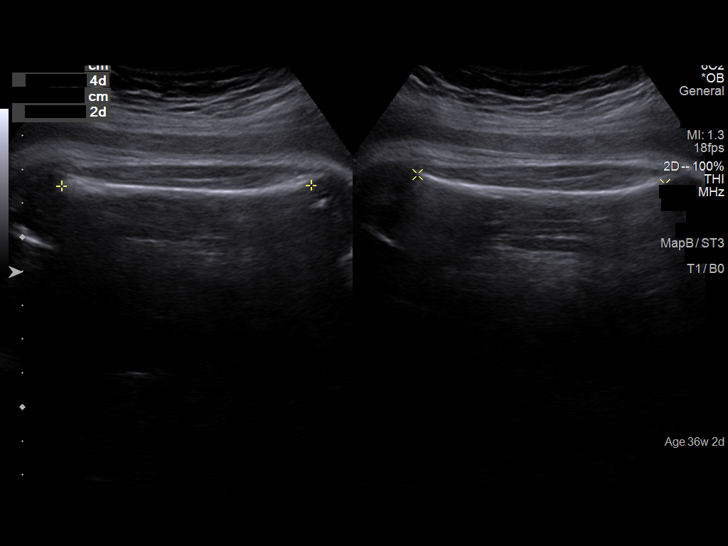
[im 53/55]
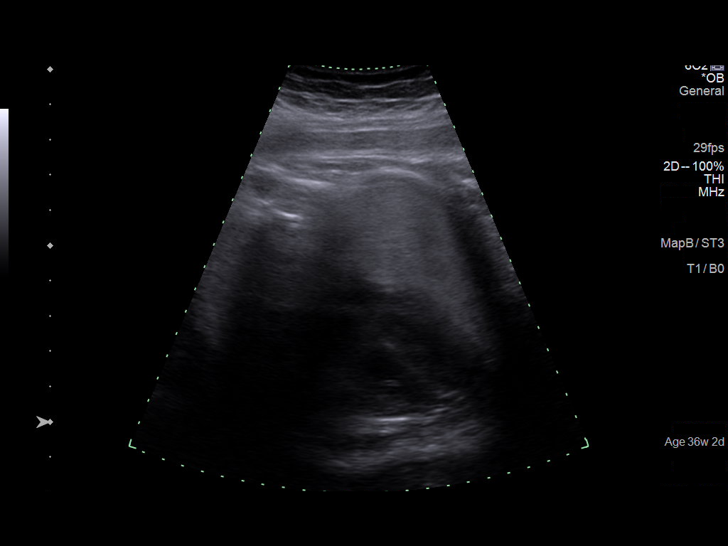

[13 of 28 positions shown; findings below may reference images not displayed]

FINDINGS: Number of Fetuses: 1

Heart Rate:  153 bpm

Movement: Yes

Presentation: Cephalic

Previa: No

Placental Location: Posterior

Amniotic Fluid (Subjective): Within normal limits

Amniotic Fluid (Objective):

Vertical pocket 5.3cm

AFI 17.0 cm (5%ile= 7.7 cm, 95%= 24.9 cm for 36 wks)

FETAL BIOMETRY

BPD:  8.9cm 36w 1d

HC:    32.1cm 36w 1d

AC:    33.3cm 37w 1d

FL:    7.3cm 37w 3d

Current Mean GA: 36w 5d US EDC: 10/29/2021

Assigned GA: 36w 2d Assigned EDC: 11/01/2021

Estimated Fetal Weight:  3,095g 73%ile

FETAL ANATOMY

Lateral Ventricles: Appears normal

Thalami/CSP: Appears normal

Posterior Fossa: Previously seen

Nuchal Region: Previously seen

Upper Lip: Previously seen

Spine: Appears normal

4 Chamber Heart on Left: Appears normal

LVOT: Previously seen

RVOT: Previously seen

Stomach on Left: Appears normal

3 Vessel Cord: Appears normal

Cord Insertion site: Previously seen

Kidneys: Appears normal

Bladder: Appears normal

Extremities: Previously seen

Sex: Previously Seen

Technical Limitations: Advanced gestational age and fetal position

Maternal Findings:

Cervix:  3.7 cm TA
IMPRESSION: Assigned GA currently thirty-six weeks 2 days. Appropriate fetal
growth, with EFW currently at 73 %ile.

Amniotic fluid volume within normal limits, with AFI of 17.0 cm.

## 2023-03-01 ENCOUNTER — Ambulatory Visit (LOCAL_COMMUNITY_HEALTH_CENTER): Payer: Self-pay

## 2023-03-01 DIAGNOSIS — Z111 Encounter for screening for respiratory tuberculosis: Secondary | ICD-10-CM

## 2023-03-04 ENCOUNTER — Ambulatory Visit (LOCAL_COMMUNITY_HEALTH_CENTER): Payer: Self-pay

## 2023-03-04 DIAGNOSIS — Z111 Encounter for screening for respiratory tuberculosis: Secondary | ICD-10-CM

## 2023-03-04 LAB — TB SKIN TEST
Induration: 0 mm
TB Skin Test: NEGATIVE

## 2023-04-11 ENCOUNTER — Emergency Department (HOSPITAL_BASED_OUTPATIENT_CLINIC_OR_DEPARTMENT_OTHER): Payer: Self-pay | Admitting: Radiology

## 2023-04-11 ENCOUNTER — Emergency Department (HOSPITAL_BASED_OUTPATIENT_CLINIC_OR_DEPARTMENT_OTHER)
Admission: EM | Admit: 2023-04-11 | Discharge: 2023-04-11 | Disposition: A | Discharge status: Home / Self Care | Payer: Self-pay | Attending: Emergency Medicine | Admitting: Emergency Medicine

## 2023-04-11 ENCOUNTER — Other Ambulatory Visit: Payer: Self-pay

## 2023-04-11 ENCOUNTER — Emergency Department (HOSPITAL_BASED_OUTPATIENT_CLINIC_OR_DEPARTMENT_OTHER): Payer: Self-pay

## 2023-04-11 ENCOUNTER — Encounter (HOSPITAL_BASED_OUTPATIENT_CLINIC_OR_DEPARTMENT_OTHER): Payer: Self-pay | Admitting: Emergency Medicine

## 2023-04-11 ENCOUNTER — Other Ambulatory Visit (HOSPITAL_BASED_OUTPATIENT_CLINIC_OR_DEPARTMENT_OTHER): Payer: Self-pay

## 2023-04-11 DIAGNOSIS — E119 Type 2 diabetes mellitus without complications: Secondary | ICD-10-CM | POA: Insufficient documentation

## 2023-04-11 DIAGNOSIS — K2900 Acute gastritis without bleeding: Secondary | ICD-10-CM | POA: Insufficient documentation

## 2023-04-11 DIAGNOSIS — Z79899 Other long term (current) drug therapy: Secondary | ICD-10-CM | POA: Insufficient documentation

## 2023-04-11 DIAGNOSIS — Z1152 Encounter for screening for COVID-19: Secondary | ICD-10-CM | POA: Insufficient documentation

## 2023-04-11 LAB — CBC
HCT: 35.1 % — ABNORMAL LOW (ref 36.0–46.0)
Hemoglobin: 11.4 g/dL — ABNORMAL LOW (ref 12.0–15.0)
MCH: 28.1 pg (ref 26.0–34.0)
MCHC: 32.5 g/dL (ref 30.0–36.0)
MCV: 86.5 fL (ref 80.0–100.0)
Platelets: 299 10*3/uL (ref 150–400)
RBC: 4.06 MIL/uL (ref 3.87–5.11)
RDW: 13.5 % (ref 11.5–15.5)
WBC: 11 10*3/uL — ABNORMAL HIGH (ref 4.0–10.5)
nRBC: 0 % (ref 0.0–0.2)

## 2023-04-11 LAB — HEPATIC FUNCTION PANEL
ALT: 16 U/L (ref 0–44)
AST: 22 U/L (ref 15–41)
Albumin: 4.1 g/dL (ref 3.5–5.0)
Alkaline Phosphatase: 52 U/L (ref 38–126)
Bilirubin, Direct: 0.2 mg/dL (ref 0.0–0.2)
Indirect Bilirubin: 0.5 mg/dL (ref 0.3–0.9)
Total Bilirubin: 0.7 mg/dL (ref 0.3–1.2)
Total Protein: 7.6 g/dL (ref 6.5–8.1)

## 2023-04-11 LAB — LIPASE, BLOOD: Lipase: 11 U/L (ref 11–51)

## 2023-04-11 LAB — TROPONIN I (HIGH SENSITIVITY)
Troponin I (High Sensitivity): 2 ng/L (ref <18)
Troponin I (High Sensitivity): 2 ng/L (ref <18)

## 2023-04-11 LAB — SARS CORONAVIRUS 2 BY RT PCR: SARS Coronavirus 2 by RT PCR: NEGATIVE

## 2023-04-11 LAB — BASIC METABOLIC PANEL
Anion gap: 10 (ref 5–15)
BUN: 7 mg/dL (ref 6–20)
CO2: 24 mmol/L (ref 22–32)
Calcium: 9.4 mg/dL (ref 8.9–10.3)
Chloride: 103 mmol/L (ref 98–111)
Creatinine, Ser: 0.63 mg/dL (ref 0.44–1.00)
GFR, Estimated: >60 mL/min (ref >60)
Glucose, Bld: 170 mg/dL — ABNORMAL HIGH (ref 70–99)
Potassium: 3.4 mmol/L — ABNORMAL LOW (ref 3.5–5.1)
Sodium: 137 mmol/L (ref 135–145)

## 2023-04-11 LAB — PREGNANCY, URINE: Preg Test, Ur: NEGATIVE

## 2023-04-11 MED ORDER — SUCRALFATE 1 G PO TABS
1.0000 g | ORAL_TABLET | Freq: Three times a day (TID) | ORAL | 0 refills | Status: DC
  Filled 2023-04-11: qty 42, 11d supply, fill #0
  Filled 2023-04-11: qty 420, 11d supply, fill #0

## 2023-04-11 MED ORDER — ALUM & MAG HYDROXIDE-SIMETH 200-200-20 MG/5ML PO SUSP
30.0000 mL | Freq: Once | ORAL | Status: AC
  Administered 2023-04-11: 30 mL via ORAL
  Filled 2023-04-11: qty 30

## 2023-04-11 MED ORDER — PANTOPRAZOLE SODIUM 40 MG IV SOLR
40.0000 mg | Freq: Once | INTRAVENOUS | Status: AC
  Administered 2023-04-11: 40 mg via INTRAVENOUS
  Filled 2023-04-11: qty 10

## 2023-04-11 MED ORDER — ONDANSETRON HCL 4 MG/2ML IJ SOLN
4.0000 mg | Freq: Once | INTRAMUSCULAR | Status: AC
  Administered 2023-04-11: 4 mg via INTRAVENOUS
  Filled 2023-04-11: qty 2

## 2023-04-11 MED ORDER — PANTOPRAZOLE SODIUM 20 MG PO TBEC
20.0000 mg | DELAYED_RELEASE_TABLET | Freq: Every day | ORAL | 0 refills | Status: DC
  Filled 2023-04-11: qty 30, 30d supply, fill #0

## 2023-04-11 NOTE — ED Notes (Signed)
Pt discharged home after verbalizing understanding of discharge instructions; nad noted. 

## 2023-04-11 NOTE — ED Triage Notes (Signed)
Pt arrives to ED with c/o chest pain, SOB, and indigestion that started this morning. She noted that she took 325 ASA this morning.

## 2023-04-11 NOTE — Discharge Instructions (Addendum)
I have given you the number for the gastroenterology group to make an appointment with if your symptoms are not improving.  Return to the emergency room if you have any worsening symptoms.  Your glucose was a little bit elevated and should be rechecked by primary care doctor.

## 2023-04-11 NOTE — ED Provider Notes (Addendum)
[place] EMERGENCY DEPARTMENT AT [facility] Provider Note   CSN: [id number] Arrival date & time: 04/11/23  4098     History  Chief Complaint  Patient presents with   Chest Pain   Shortness of Breath    Jordan Mathis is a 37 y.o. female.  Patient is a 37 year old female with a history of gestational diabetes and preeclampsia.  She is no longer on medications for this.  She presents with upper abdominal and chest pain.  She said she started having some indigestion and pain in her stomach area this morning and then it started radiating into her chest.  She says it comes in waves.  When it hurts it is in the center of her chest.  Sometimes it radiates to her back but not now.  She denies any associated shortness of breath other than when the pain gets intense she feels a little short of breath.  No exertional symptoms.  No leg pain or swelling.  No cough or cold symptoms.  No prior history of known heart disease.  No prior surgical history other than cesarean section.       Home Medications Prior to Admission medications   Medication Sig Start Date End Date Taking? Authorizing Provider  pantoprazole (PROTONIX) 20 MG tablet Take 1 tablet (20 mg total) by mouth daily. 04/11/23  Yes Rolan Bucco, MD  sucralfate (CARAFATE) 1 GM/10ML suspension Take 10 mLs (1 g total) by mouth 4 (four) times daily -  with meals and at bedtime. 04/11/23  Yes Rolan Bucco, MD  amLODipine (NORVASC) 10 MG tablet Take 1 tablet (10 mg total) by mouth daily. Patient not taking: Reported on 03/01/2023 11/03/21   Hildred Laser, MD  furosemide (LASIX) 20 MG tablet Take 1 tablet (20 mg total) by mouth daily. Patient not taking: Reported on 03/01/2023 11/11/21   Hildred Laser, MD  Prenatal Vit-Fe Fumarate-FA (MULTIVITAMIN-PRENATAL) 27-0.8 MG TABS tablet Take 1 tablet by mouth daily at 12 noon. Patient not taking: Reported on 03/01/2023 08/24/21   Hildred Laser, MD      Allergies    Amoxicillin,  Penicillins, and Penicillins    Review of Systems   Review of Systems  Constitutional:  Negative for chills, diaphoresis, fatigue and fever.  HENT:  Negative for congestion, rhinorrhea and sneezing.   Eyes: Negative.   Respiratory:  Negative for cough, chest tightness and shortness of breath.   Cardiovascular:  Positive for chest pain. Negative for leg swelling.  Gastrointestinal:  Positive for abdominal pain and nausea. Negative for blood in stool, diarrhea and vomiting.  Genitourinary:  Negative for difficulty urinating, flank pain, frequency and hematuria.  Musculoskeletal:  Negative for arthralgias and back pain.  Skin:  Negative for rash.  Neurological:  Negative for dizziness, speech difficulty, weakness, numbness and headaches.    Physical Exam Updated Vital Signs BP 128/74   Pulse 79   Temp 97.9 F (36.6 C) (Oral)   Resp 15   Ht 5\' 3"  (1.6 m)   Wt 84.8 kg   SpO2 100%   BMI 33.13 kg/m  Physical Exam Constitutional:      Appearance: She is well-developed.     Comments: Appears uncomfortable  HENT:     Head: Normocephalic and atraumatic.  Eyes:     Pupils: Pupils are equal, round, and reactive to light.  Cardiovascular:     Rate and Rhythm: Normal rate and regular rhythm.     Heart sounds: Normal heart sounds.  Pulmonary:  Effort: Pulmonary effort is normal. No respiratory distress.     Breath sounds: Normal breath sounds. No wheezing or rales.  Chest:     Chest wall: No tenderness.  Abdominal:     General: Bowel sounds are normal.     Palpations: Abdomen is soft.     Tenderness: There is abdominal tenderness (Tenderness to the epigastric area). There is no guarding or rebound.  Musculoskeletal:        General: Normal range of motion.     Cervical back: Normal range of motion and neck supple.  Lymphadenopathy:     Cervical: No cervical adenopathy.  Skin:    General: Skin is warm and dry.     Findings: No rash.  Neurological:     Mental Status: She is  alert and oriented to person, place, and time.     ED Results / Procedures / Treatments   Labs (all labs ordered are listed, but only abnormal results are displayed) Labs Reviewed  BASIC METABOLIC PANEL - Abnormal; Notable for the following components:      Result Value   Potassium 3.4 (*)    Glucose, Bld 170 (*)    All other components within normal limits  CBC - Abnormal; Notable for the following components:   WBC 11.0 (*)    Hemoglobin 11.4 (*)    HCT 35.1 (*)    All other components within normal limits  SARS CORONAVIRUS 2 BY RT PCR  PREGNANCY, URINE  HEPATIC FUNCTION PANEL  LIPASE, BLOOD  TROPONIN I (HIGH SENSITIVITY)  TROPONIN I (HIGH SENSITIVITY)    EKG EKG Interpretation  Date/Time:  Monday April 11 2023 08:30:07 EDT Ventricular Rate:  82 PR Interval:  150 QRS Duration: 85 QT Interval:  413 QTC Calculation: 483 R Axis:   42 Text Interpretation: Sinus rhythm Nonspecific T abnormalities, lateral leads Borderline prolonged QT interval No old tracing to compare Confirmed by Rolan Bucco [phone]) on 04/11/2023 8:43:34 AM  Radiology US Abdomen Limited RUQ (LIVER/GB)  Result Date: 04/11/2023 CLINICAL DATA:  Right upper quadrant pain since earlier this morning. EXAM: ULTRASOUND ABDOMEN LIMITED RIGHT UPPER QUADRANT COMPARISON:  None Available. FINDINGS: Gallbladder: No gallstones or wall thickening visualized. No sonographic Murphy sign noted by sonographer. Common bile duct: Diameter: 4.0 mm, within normal limits. No intrahepatic or extrahepatic biliary ductal dilatation. Liver: No focal lesion identified. Within normal limits in parenchymal echogenicity. Portal vein is patent on color Doppler imaging with normal direction of blood flow towards the liver. Other: None. IMPRESSION: Normal right upper quadrant ultrasound. Electronically Signed   By: Neita Garnet M.D.   On: 04/11/2023 10:07   DG Chest 2 View  Result Date: 04/11/2023 CLINICAL DATA:  Chest pain, shortness of  breath EXAM: CHEST - 2 VIEW COMPARISON:  03/23/2017 FINDINGS: Transient diameter of heart is increased. Low lung volumes. There are no signs of pulmonary edema or focal pulmonary consolidation. There is no pleural effusion or pneumothorax. IMPRESSION: There are no focal infiltrates or signs of pulmonary edema. Electronically Signed   By: Ernie Avena M.D.   On: 04/11/2023 08:57    Procedures Procedures    Medications Ordered in ED Medications  pantoprazole (PROTONIX) injection 40 mg (40 mg Intravenous Given 04/11/23 0932)  alum & mag hydroxide-simeth (MAALOX/MYLANTA) 200-200-20 MG/5ML suspension 30 mL (30 mLs Oral Given 04/11/23 0936)  ondansetron (ZOFRAN) injection 4 mg (4 mg Intravenous Given 04/11/23 0931)    ED Course/ Medical Decision Making/ A&P  Medical Decision Making Amount and/or Complexity of Data Reviewed Labs: ordered. Radiology: ordered.  Risk OTC drugs. Prescription drug management.   Patient is a 37 year old female who presents with epigastric pain radiating to her chest.  She is tender in the epigastrium.  Her EKG does not show any ischemic changes.  She has had 2 negative troponins.  Doubt ACS.  I feel like it is more likely coming from a GI origin.  She had a gallbladder ultrasound which was negative.  Her labs do not reveal any evidence of pancreatitis.  Her LFTs are normal.  She was given a GI cocktail as well as some Protonix.  Overall is feeling better with this.  Her intense pain has resolved.  Suspect she has gastritis.  Patient was discharged home in good condition.  Was discussed using a bland diet.  Was given prescriptions for Protonix and Carafate.  She does not currently have a PCP so was given a referral to follow-up with gastroenterology if her symptoms are not improving.  Return precautions were given.  Her glucose was mildly elevated.  Advised that she needs to have this rechecked by primary care doctor.  Final Clinical  Impression(s) / ED Diagnoses Final diagnoses:  Acute gastritis without hemorrhage, unspecified gastritis type    Rx / DC Orders ED Discharge Orders          Ordered    pantoprazole (PROTONIX) 20 MG tablet  Daily        04/11/23 1203    sucralfate (CARAFATE) 1 GM/10ML suspension  3 times daily with meals & bedtime        04/11/23 1203              Rolan Bucco, MD 04/11/23 1205    Rolan Bucco, MD 04/11/23 1207

## 2023-04-23 ENCOUNTER — Other Ambulatory Visit (HOSPITAL_BASED_OUTPATIENT_CLINIC_OR_DEPARTMENT_OTHER): Payer: Self-pay

## 2023-09-02 NOTE — Progress Notes (Unsigned)
Outpatient Gynecology Note: Annual Visit  Assessment/Plan:    Jordan Mathis is a 37 y.o. female [phone] with normal well-woman gynecologic exam.   Well woman exam - Reviewed health maintenance topics as documented below. - Most recent pap smear in 2021, NILM, HPV neg, repeat cervical cancer screening due 2026 per ASCCP guidelines . After discussion, patient desires repeat pap smear today. - First dose of Gardasil vaccine given today. Second and third doses due in one and six months. - STI screening accepted.   Vaginal bleeding - Reviewed possible etiologies and management of spotting in between periods.  - TVUS, TSH, and CBC ordered today. - Patient desires to start COCs today as she has used them in the past and they have worked well for her. Reviewed theoretical risk of smoking and blood clots with estrogen containing birth control. She plans to reduce her smoking at this time.      Risk factors identified in Subjective to review: smoking Orders Placed This Encounter  Procedures   US PELVIS TRANSVAGINAL NON-OB (TV ONLY)    Standing Status:   Future    Standing Expiration Date:   09/04/2024    Order Specific Question:   Reason for Exam (SYMPTOM  OR DIAGNOSIS REQUIRED)    Answer:   Pelvic Pain    Order Specific Question:   Preferred imaging location?    Answer:   Internal   HPV 9-valent vaccine,Recombinat   TSH + free T4   CBC   HEP, RPR, HIV Panel   Current Outpatient Medications  Medication Instructions   amLODipine (NORVASC) 10 mg, Oral, Daily   drospirenone-ethinyl estradiol (YASMIN 28) 3-0.03 MG tablet 1 tablet, Oral, Daily   furosemide (LASIX) 20 mg, Oral, Daily   pantoprazole (PROTONIX) 20 mg, Oral, Daily   Prenatal Vit-Fe Fumarate-FA (MULTIVITAMIN-PRENATAL) 27-0.8 MG TABS tablet 1 tablet, Oral, Daily   sucralfate (CARAFATE) 1 g, Oral, 3 times daily with meals & bedtime    No follow-ups on file.    Subjective:    Jordan Mathis is a  37 y.o. female [phone] who presents for annual wellness visit.   CONCERNS? Bleeding in between periods, cramping all the time. Spotting throughout cycle with heavy periods.   Well Woman Visit:  GYN HISTORY:  Patient's last menstrual period was 08/25/2023.     Menstrual History: OB History     Gravida  4   Para  3   Term  3   Preterm  0   AB  1   Living  3      SAB  0   IAB  1   Ectopic  0   Multiple  0   Live Births  37           Menarche age: 45 Patient's last menstrual period was 08/25/2023. Period Cycle (Days): 28 Period Duration (Days): 3-7 Period Pattern: Regular Menstrual Flow: Heavy Menstrual Control: Panty liner, Thin pad, Maxi pad Dysmenorrhea: (!) Moderate Dysmenorrhea Symptoms: Cramping   Intermenstrual bleeding, spotting, or discharge? yes Urinary incontinence? no  Sexually active: ye Number of sexual partners: one Gender of sexual Partners: male Dyspareunia? Sometimes, when bleeding is bad Last pap:was normal in 2021 History of abnormal Pap: no Gardasil series:  only got one shot, desires to restart series STI history: chlamydia STI/HIV testing or immunizations needed? Yes.    Contraceptive methods: no method  Health Maintenance > Reviewed breast self-awareness, no family history  > History of abnormal mammogram: No > Exercise:  none, not active > Dietary Supplements: probiotics and multivitamin and collagen > Body mass index is 29.76 kg/m.  > Recent dental visit No. > Seat Belt Use: Yes.   > Texting and driving? No. > Guns in the house No. > Concern for alcohol abuse? Does not drink regularly   Tobacco or other drug use: About 2 cigarettes per day.  Tobacco Use: Low Risk  (09/05/2023)   Patient History    Smoking Tobacco Use: Never    Smokeless Tobacco Use: Never    Passive Exposure: Not on file      _________________________________________________________  Current Outpatient Medications  Medication Sig Dispense  Refill   drospirenone-ethinyl estradiol (YASMIN 28) 3-0.03 MG tablet Take 1 tablet by mouth daily. 28 tablet 11   amLODipine (NORVASC) 10 MG tablet Take 1 tablet (10 mg total) by mouth daily. (Patient not taking: Reported on 03/01/2023) 90 tablet 0   furosemide (LASIX) 20 MG tablet Take 1 tablet (20 mg total) by mouth daily. (Patient not taking: Reported on 03/01/2023) 5 tablet 0   pantoprazole (PROTONIX) 20 MG tablet Take 1 tablet (20 mg total) by mouth daily. (Patient not taking: Reported on 09/05/2023) 30 tablet 0   Prenatal Vit-Fe Fumarate-FA (MULTIVITAMIN-PRENATAL) 27-0.8 MG TABS tablet Take 1 tablet by mouth daily at 12 noon. (Patient not taking: Reported on 03/01/2023) 30 tablet 11   sucralfate (CARAFATE) 1 g tablet Take 1 tablet (1 g total) by mouth 4 (four) times daily -  with meals and at bedtime. (Patient not taking: Reported on 09/05/2023) 42 tablet 0   No current facility-administered medications for this visit.   Allergies  Allergen Reactions   Amoxicillin Hives   Penicillins Hives   Penicillins Hives    Past Medical History:  Diagnosis Date   Chlamydia 03/2021   GERD (gastroesophageal reflux disease)    Gestational diabetes    History of anemia    History of C-section    History of gestational diabetes mellitus (GDM)    History of pre-eclampsia    History of urinary tract infection    Past Surgical History:  Procedure Laterality Date   CESAREAN SECTION  10/02/2014   OB History     Gravida  4   Para  3   Term  3   Preterm  0   AB  1   Living  3      SAB  0   IAB  1   Ectopic  0   Multiple  0   Live Births  3          Social History   Tobacco Use   Smoking status: Never   Smokeless tobacco: Never  Substance Use Topics   Alcohol use: Never    Comment: Last ETOH use end of 05/2020.   Social History   Substance and Sexual Activity  Sexual Activity Yes   Comment: Undecided    Immunization History  Administered Date(s) Administered    Moderna Sars-Covid-2 Vaccination 12/31/2019, 01/21/2020, 02/02/2020, 02/18/2020   PPD Test 03/01/2023     Review Of Systems  Constitutional: Denied constitutional symptoms, night sweats, recent illness, fatigue, fever, insomnia and weight loss.  Eyes: Denied eye symptoms, eye pain, photophobia, vision change and visual disturbance.  Ears/Nose/Throat/Neck: Denied ear, nose, throat or neck symptoms, hearing loss, nasal discharge, sinus congestion and sore throat.  Cardiovascular: Denied cardiovascular symptoms, arrhythmia, chest pain/pressure, edema, exercise intolerance, orthopnea and palpitations.  Respiratory: Denied pulmonary symptoms, asthma, pleuritic pain, productive sputum, cough, dyspnea  and wheezing.  Gastrointestinal: Denied, gastro-esophageal reflux, melena, nausea and vomiting.  Genitourinary: Cramping and bleeding in between periods, heavy periods.  Musculoskeletal: Denied musculoskeletal symptoms, stiffness, swelling, muscle weakness and myalgia.  Dermatologic: Denied dermatology symptoms, rash and scar.  Neurologic: Denied neurology symptoms, dizziness, headache, neck pain and syncope.  Psychiatric: Denied psychiatric symptoms, anxiety and depression.  Endocrine: Denied endocrine symptoms including hot flashes and night sweats.      Objective:    BP (!) 142/96   Pulse 97   Ht 5\' 3"  (1.6 m)   Wt 168 lb (76.2 kg)   LMP 08/25/2023   Breastfeeding No   BMI 29.76 kg/m   Constitutional: Well-developed, well-nourished female in no acute distress Neurological: Alert and oriented to person, place, and time Psychiatric: Mood and affect appropriate Skin: No rashes or lesions Neck: Supple without masses. Trachea is midline.Thyroid is normal size without masses Lymphatics: No cervical, axillary, supraclavicular, or inguinal adenopathy noted Respiratory: Clear to auscultation bilaterally. Good air movement with normal work of breathing. Cardiovascular: Regular rate and rhythm.  Extremities grossly normal, nontender with no edema; pulses regular Gastrointestinal: Soft, nontender, nondistended. No masses or hernias appreciated. No hepatosplenomegaly. No fluid wave. No rebound or guarding. Breast Exam: Deferred with shared decision-making Genitourinary:         External Genitalia: Normal female genitalia    Vagina: well-rugated, no lesions.    Cervix: No lesions, normal size and consistency; no cervical motion tenderness     Uterus: Tenderness on palpation, Normal size and contour; smooth, mobile, NT, anteverted. Adnexae: Non-palpable  Perineum/Anus: No lesions Rectal: deferred    Autumn Messing, [facility]  09/05/23 9:38 AM

## 2023-09-05 ENCOUNTER — Ambulatory Visit (INDEPENDENT_AMBULATORY_CARE_PROVIDER_SITE_OTHER): Payer: Medicaid Other

## 2023-09-05 ENCOUNTER — Other Ambulatory Visit (HOSPITAL_COMMUNITY)
Admission: RE | Admit: 2023-09-05 | Discharge: 2023-09-05 | Disposition: A | Discharge status: Home / Self Care | Payer: Medicaid Other | Source: Ambulatory Visit

## 2023-09-05 VITALS — BP 148/108 | HR 97 | Ht 63.0 in | Wt 168.0 lb

## 2023-09-05 DIAGNOSIS — Z23 Encounter for immunization: Secondary | ICD-10-CM | POA: Diagnosis not present

## 2023-09-05 DIAGNOSIS — Z01419 Encounter for gynecological examination (general) (routine) without abnormal findings: Secondary | ICD-10-CM

## 2023-09-05 DIAGNOSIS — Z0000 Encounter for general adult medical examination without abnormal findings: Secondary | ICD-10-CM

## 2023-09-05 DIAGNOSIS — Z113 Encounter for screening for infections with a predominantly sexual mode of transmission: Secondary | ICD-10-CM | POA: Diagnosis present

## 2023-09-05 DIAGNOSIS — N939 Abnormal uterine and vaginal bleeding, unspecified: Secondary | ICD-10-CM

## 2023-09-05 MED ORDER — DROSPIRENONE-ETHINYL ESTRADIOL 3-0.03 MG PO TABS: 1.0000 | ORAL_TABLET | Freq: Every day | ORAL | 11 refills | Status: DC

## 2023-09-05 NOTE — Progress Notes (Signed)
Outpatient Gynecology Note: Annual Visit   Assessment/Plan:    Plan Jordan Mathis is a 37 y.o. female [phone] with normal well-woman gynecologic exam.   Well woman exam - Reviewed health maintenance topics as documented below. - Most recent pap smear in 2021, NILM, HPV neg, repeat cervical cancer screening due 2026 per ASCCP guidelines . After discussion, patient desires repeat pap smear today. - First dose of Gardasil vaccine given today. Second and third doses due in one and six months. - STI screening accepted.    Vaginal bleeding - Reviewed possible etiologies and management of spotting in between periods.  - TVUS, TSH, and CBC ordered today. - Patient desires to start COCs today as she has used them in the past and they have worked well for her. Reviewed theoretical risk of smoking and blood clots with estrogen containing birth control. She plans to reduce her smoking at this time.        Risk factors identified in Subjective to review: smoking      Orders Placed This Encounter  Procedures   US PELVIS TRANSVAGINAL NON-OB (TV ONLY)      Standing Status:   Future      Standing Expiration Date:   09/04/2024      Order Specific Question:   Reason for Exam (SYMPTOM  OR DIAGNOSIS REQUIRED)      Answer:   Pelvic Pain      Order Specific Question:   Preferred imaging location?      Answer:   Internal   HPV 9-valent vaccine,Recombinat   TSH + Akshath Mccarey T4   CBC   HEP, RPR, HIV Panel        Current Outpatient Medications  Medication Instructions   amLODipine (NORVASC) 10 mg, Oral, Daily   drospirenone-ethinyl estradiol (YASMIN 28) 3-0.03 MG tablet 1 tablet, Oral, Daily   furosemide (LASIX) 20 mg, Oral, Daily   pantoprazole (PROTONIX) 20 mg, Oral, Daily   Prenatal Vit-Fe Fumarate-FA (MULTIVITAMIN-PRENATAL) 27-0.8 MG TABS tablet 1 tablet, Oral, Daily   sucralfate (CARAFATE) 1 g, Oral, 3 times daily with meals & bedtime      No follow-ups on file.        Subjective:    Subjective Jordan Mathis is a 37 y.o. female [phone] who presents for annual wellness visit.    CONCERNS? Bleeding in between periods, cramping all the time. Spotting throughout cycle with heavy periods.    Well Woman Visit:  GYN HISTORY:  Patient's last menstrual period was 08/25/2023.     Menstrual History: OB History       Gravida  4   Para  3   Term  3   Preterm  0   AB  1   Living  3        SAB  0   IAB  1   Ectopic  0   Multiple  0   Live Births  56             Menarche age: 52 Patient's last menstrual period was 08/25/2023. Period Cycle (Days): 28 Period Duration (Days): 3-7 Period Pattern: Regular Menstrual Flow: Heavy Menstrual Control: Panty liner, Thin pad, Maxi pad Dysmenorrhea: (!) Moderate Dysmenorrhea Symptoms: Cramping   Intermenstrual bleeding, spotting, or discharge? yes Urinary incontinence? no   Sexually active: ye Number of sexual partners: one Gender of sexual Partners: male Dyspareunia? Sometimes, when bleeding is bad Last pap:was normal in 2021 History of abnormal Pap: no Gardasil series:  only got one shot, desires to restart series STI history: chlamydia STI/HIV testing or immunizations needed? Yes.    Contraceptive methods: no method   Health Maintenance > Reviewed breast self-awareness, no family history  > History of abnormal mammogram: No > Exercise: none, not active > Dietary Supplements: probiotics and multivitamin and collagen > Body mass index is 29.76 kg/m.  > Recent dental visit No. > Seat Belt Use: Yes.   > Texting and driving? No. > Guns in the house No. > Concern for alcohol abuse? Does not drink regularly    Tobacco or other drug use: About 2 cigarettes per day.      Tobacco Use: Low Risk  (09/05/2023)    Patient History     Smoking Tobacco Use: Never     Smokeless Tobacco Use: Never     Passive Exposure: Not on file         _________________________________________________________         Current Outpatient Medications  Medication Sig Dispense Refill   drospirenone-ethinyl estradiol (YASMIN 28) 3-0.03 MG tablet Take 1 tablet by mouth daily. 28 tablet 11   amLODipine (NORVASC) 10 MG tablet Take 1 tablet (10 mg total) by mouth daily. (Patient not taking: Reported on 03/01/2023) 90 tablet 0   furosemide (LASIX) 20 MG tablet Take 1 tablet (20 mg total) by mouth daily. (Patient not taking: Reported on 03/01/2023) 5 tablet 0   pantoprazole (PROTONIX) 20 MG tablet Take 1 tablet (20 mg total) by mouth daily. (Patient not taking: Reported on 09/05/2023) 30 tablet 0   Prenatal Vit-Fe Fumarate-FA (MULTIVITAMIN-PRENATAL) 27-0.8 MG TABS tablet Take 1 tablet by mouth daily at 12 noon. (Patient not taking: Reported on 03/01/2023) 30 tablet 11   sucralfate (CARAFATE) 1 g tablet Take 1 tablet (1 g total) by mouth 4 (four) times daily -  with meals and at bedtime. (Patient not taking: Reported on 09/05/2023) 42 tablet 0      No current facility-administered medications for this visit.      Allergies      Allergies  Allergen Reactions   Amoxicillin Hives   Penicillins Hives   Penicillins Hives            Past Medical History:  Diagnosis Date   Chlamydia 03/2021   GERD (gastroesophageal reflux disease)     Gestational diabetes     History of anemia     History of C-section     History of gestational diabetes mellitus (GDM)     History of pre-eclampsia     History of urinary tract infection               Past Surgical History:  Procedure Laterality Date   CESAREAN SECTION   10/02/2014        OB History       Gravida  4   Para  3   Term  3   Preterm  0   AB  1   Living  3        SAB  0   IAB  1   Ectopic  0   Multiple  0   Live Births  3             Social History         Tobacco Use   Smoking status: Never   Smokeless tobacco: Never  Substance Use Topics   Alcohol use: Never       Comment: Last ETOH use end of 05/2020.  Social History        Substance and Sexual Activity  Sexual Activity Yes    Comment: Undecided          Immunization History  Administered Date(s) Administered   Moderna Sars-Covid-2 Vaccination 12/31/2019, 01/21/2020, 02/02/2020, 02/18/2020   PPD Test 03/01/2023        Review Of Systems  Constitutional: Denied constitutional symptoms, night sweats, recent illness, fatigue, fever, insomnia and weight loss.  Eyes: Denied eye symptoms, eye pain, photophobia, vision change and visual disturbance.  Ears/Nose/Throat/Neck: Denied ear, nose, throat or neck symptoms, hearing loss, nasal discharge, sinus congestion and sore throat.  Cardiovascular: Denied cardiovascular symptoms, arrhythmia, chest pain/pressure, edema, exercise intolerance, orthopnea and palpitations.  Respiratory: Denied pulmonary symptoms, asthma, pleuritic pain, productive sputum, cough, dyspnea and wheezing.  Gastrointestinal: Denied, gastro-esophageal reflux, melena, nausea and vomiting.  Genitourinary: Cramping and bleeding in between periods, heavy periods.  Musculoskeletal: Denied musculoskeletal symptoms, stiffness, swelling, muscle weakness and myalgia.  Dermatologic: Denied dermatology symptoms, rash and scar.  Neurologic: Denied neurology symptoms, dizziness, headache, neck pain and syncope.  Psychiatric: Denied psychiatric symptoms, anxiety and depression.  Endocrine: Denied endocrine symptoms including hot flashes and night sweats.          Objective:    Objective[] Expand by Default BP (!) 142/96   Pulse 97   Ht 5\' 3"  (1.6 m)   Wt 168 lb (76.2 kg)   LMP 08/25/2023   Breastfeeding No   BMI 29.76 kg/m    Constitutional: Well-developed, well-nourished female in no acute distress Neurological: Alert and oriented to person, place, and time Psychiatric: Mood and affect appropriate Skin: No rashes or lesions Neck: Supple without masses. Trachea is  midline.Thyroid is normal size without masses Lymphatics: No cervical, axillary, supraclavicular, or inguinal adenopathy noted Respiratory: Clear to auscultation bilaterally. Good air movement with normal work of breathing. Cardiovascular: Regular rate and rhythm. Extremities grossly normal, nontender with no edema; pulses regular Gastrointestinal: Soft, nontender, nondistended. No masses or hernias appreciated. No hepatosplenomegaly. No fluid wave. No rebound or guarding. Breast Exam: Deferred with shared decision-making Genitourinary:                            External Genitalia: Normal female genitalia               Vagina: well-rugated, no lesions.               Cervix: No lesions, normal size and consistency; no cervical motion tenderness                Uterus: Tenderness on palpation, Normal size and contour; smooth, mobile, NT, anteverted. Adnexae: Non-palpable  Perineum/Anus: No lesions Rectal: deferred       Autumn Messing, [facility]  09/05/23 9:38 AM

## 2023-09-05 NOTE — Assessment & Plan Note (Addendum)
-   Reviewed health maintenance topics as documented below. - Most recent pap smear in 2021, NILM, HPV neg, repeat cervical cancer screening due 2026 per ASCCP guidelines . After discussion, patient desires repeat pap smear today. - First dose of Gardasil vaccine given today. Second and third doses due in one and six months. - STI screening accepted.

## 2023-09-05 NOTE — Assessment & Plan Note (Signed)
-   Reviewed possible etiologies and management of spotting in between periods.  - TVUS, TSH, and CBC ordered today. - Patient desires to start COCs today as she has used them in the past and they have worked well for her. Reviewed theoretical risk of smoking and blood clots with estrogen containing birth control. She plans to reduce her smoking at this time.

## 2023-09-06 LAB — CBC
Hematocrit: 37.9 % (ref 34.0–46.6)
Hemoglobin: 11.8 g/dL (ref 11.1–15.9)
MCH: 26.9 pg (ref 26.6–33.0)
MCHC: 31.1 g/dL — ABNORMAL LOW (ref 31.5–35.7)
MCV: 87 fL (ref 79–97)
Platelets: 352 10*3/uL (ref 150–450)
RBC: 4.38 x10E6/uL (ref 3.77–5.28)
RDW: 12.9 % (ref 11.7–15.4)
WBC: 7.2 10*3/uL (ref 3.4–10.8)

## 2023-09-06 LAB — HEP, RPR, HIV PANEL
HIV Screen 4th Generation wRfx: NONREACTIVE
Hepatitis B Surface Ag: NEGATIVE
RPR Ser Ql: NONREACTIVE

## 2023-09-06 LAB — TSH+FREE T4
Free T4: 1.3 ng/dL (ref 0.82–1.77)
TSH: 1.19 u[IU]/mL (ref 0.450–4.500)

## 2023-09-12 ENCOUNTER — Telehealth: Payer: Self-pay

## 2023-09-12 LAB — CYTOLOGY - PAP
Chlamydia: NEGATIVE
Comment: NEGATIVE
Comment: NEGATIVE
Comment: NEGATIVE
Comment: NEGATIVE
Comment: NEGATIVE
Comment: NORMAL
HPV 16: NEGATIVE
HPV 18 / 45: POSITIVE — AB
High risk HPV: POSITIVE — AB
Neisseria Gonorrhea: NEGATIVE
Trichomonas: NEGATIVE

## 2023-09-12 NOTE — Telephone Encounter (Signed)
Spoke with patient. Advised pap smear results are not final as they are awaiting co-testing. Preliminary results show STD's (gonorrhea, chlamydia/Trich) negative/normal. Patient will await final pap smear results.

## 2023-09-12 NOTE — Telephone Encounter (Signed)
Spoke with Jordan Mathis. She advised that all STD testing on pap (gonorrhea, chlamydia & trich) were negative. Pap is positive for HPV and is awaiting co-testing which should be done by end of this week.

## 2023-09-12 NOTE — Telephone Encounter (Signed)
TRIAGE VOICEMAIL: Patient states she had some lab tests done. She reports she was supposed to have more testing done. She only sees one test done for STD. Also, she is inquiring about pap smear results. It's been over a week for that. She states she is still having discharge and cramping and she sees that she wasn't tested for the STD that requested to be tested for.

## 2023-09-14 ENCOUNTER — Telehealth: Payer: Self-pay

## 2023-09-14 DIAGNOSIS — N939 Abnormal uterine and vaginal bleeding, unspecified: Secondary | ICD-10-CM

## 2023-09-14 MED ORDER — MEDROXYPROGESTERONE ACETATE 10 MG PO TABS: 10.0000 mg | ORAL_TABLET | Freq: Every day | ORAL | 0 refills | Status: DC

## 2023-09-14 NOTE — Telephone Encounter (Signed)
Telephone call from patient. Verified patient name and DOB. Reviewed pap smear results with patient. She understands need to schedule colposcopy. She is wondering if the abnormal pap smear results could explain the ongoing cramping and bleeding she is having. Discussed that the combination of bleeding and cramping was most likely not related to cervical cellular changes. Will prescribe 10 day course of Provera.

## 2023-09-27 ENCOUNTER — Encounter: Payer: Medicaid Other | Admitting: Obstetrics and Gynecology

## 2023-09-29 ENCOUNTER — Ambulatory Visit (INDEPENDENT_AMBULATORY_CARE_PROVIDER_SITE_OTHER): Payer: Self-pay

## 2023-09-29 ENCOUNTER — Telehealth: Payer: Self-pay

## 2023-09-29 ENCOUNTER — Ambulatory Visit: Payer: Medicaid Other

## 2023-09-29 VITALS — BP 143/93 | HR 113 | Ht 63.0 in | Wt 172.0 lb

## 2023-09-29 DIAGNOSIS — N926 Irregular menstruation, unspecified: Secondary | ICD-10-CM

## 2023-09-29 DIAGNOSIS — N912 Amenorrhea, unspecified: Secondary | ICD-10-CM

## 2023-09-29 DIAGNOSIS — Z3201 Encounter for pregnancy test, result positive: Secondary | ICD-10-CM

## 2023-09-29 LAB — POCT URINE PREGNANCY: Preg Test, Ur: POSITIVE — AB

## 2023-09-29 NOTE — Progress Notes (Signed)
    NURSE VISIT NOTE  Subjective:    Patient ID: [id number] Jordan Mathis, female    DOB: May 24, 1986, 37 y.o.   MRN: [id number]  HPI  Patient is a 37 y.o. [id number] female who presents for evaluation of amenorrhea. She believes she could be pregnant. Pregnancy is desired. Sexual Activity: single partner, contraception: none. Current symptoms also include: breast tenderness, fatigue, frequent urination, morning sickness, nausea, and positive home pregnancy test. Last period was normal.    Objective:    BP (!) 143/93   Pulse (!) 113   Ht 5\' 3"  (1.6 m)   Wt 172 lb (78 kg)   LMP 08/25/2023   BMI 30.47 kg/m   Lab Review  Results for orders placed or performed in visit on 09/29/23  POCT urine pregnancy  Result Value Ref Range   Preg Test, Ur Positive (A) Negative    Assessment:   1. Missed period     Plan:   Pregnancy Test: Positive  Estimated Date of Delivery:.05/31/24 BP Cuff Measurement taken. Cuff Size Adult Small Encouraged well-balanced diet, plenty of rest when needed, pre-natal vitamins daily and walking for exercise.  Discussed self-help for nausea, avoiding OTC medications until consulting provider or pharmacist, other than Tylenol as needed, minimal caffeine (1-2 cups daily) and avoiding alcohol.   She will schedule her nurse visit @ 7-[redacted] wks pregnant, u/s for dating @10  wk, and NOB visit at [redacted] wk pregnant.    Feel free to call with any questions.     Cornelius Moras, CMA

## 2023-09-29 NOTE — Telephone Encounter (Signed)
This pt came in for a pregnancy confirmation today and her BP was 143/93 she states its been high at home 150/100. She seen you 09/05/23 for her annual, bp was 143/93 you advised her to see her pcp. She didn't and now she pregnant. So is this something you could handle for her? She states her bp has been high since she had her son a year ago.

## 2023-09-30 NOTE — Telephone Encounter (Signed)
Does this pt need to come in for an appointment to be put on BP meds or can something be sent in. Per Noelle Penner she would have to consult with one of the Doctors before prescribing medication. Huntley Dec did not want the pt waiting until her [facility] appointment to be started on medication.  Please advise.

## 2023-09-30 NOTE — Telephone Encounter (Signed)
I contacted the patient via phone, she is scheduled for 12/17 at 2:55 with Dr Valentino Saxon.

## 2023-10-04 ENCOUNTER — Encounter: Payer: Self-pay | Admitting: Obstetrics and Gynecology

## 2023-10-04 ENCOUNTER — Ambulatory Visit (INDEPENDENT_AMBULATORY_CARE_PROVIDER_SITE_OTHER): Payer: Self-pay | Admitting: Obstetrics and Gynecology

## 2023-10-04 VITALS — BP 133/89 | HR 88 | Ht 63.0 in | Wt 178.1 lb

## 2023-10-04 DIAGNOSIS — O161 Unspecified maternal hypertension, first trimester: Secondary | ICD-10-CM

## 2023-10-04 DIAGNOSIS — Z3A01 Less than 8 weeks gestation of pregnancy: Secondary | ICD-10-CM

## 2023-10-04 DIAGNOSIS — Z8759 Personal history of other complications of pregnancy, childbirth and the puerperium: Secondary | ICD-10-CM

## 2023-10-04 MED ORDER — NIFEDIPINE ER OSMOTIC RELEASE 30 MG PO TB24: 30.0000 mg | ORAL_TABLET | Freq: Every day | ORAL | 3 refills | Status: AC

## 2023-10-04 NOTE — Progress Notes (Signed)
ROB [redacted]w[redacted]d: Patient state she had pre eclampsia in prior pregnancy and was on Norvasc. She has not had any issues with blood pressure or been on bp meds since last pregnancy.

## 2023-10-04 NOTE — Progress Notes (Signed)
    GYNECOLOGY PROGRESS NOTE  Subjective:    Patient ID: [id number] Jordan Mathis, female    DOB: 1986-01-08, 37 y.o.   MRN: [id number]  HPI  Patient is a 37 y.o. [id number] female who presents for management of hypertension and pregnancy.  She is currently 5 weeks 5 days pregnant by LMP 08/25/2023.  She was seen for her confirmation appointment last week and her blood pressures were noted to be elevated at that time (143/93).  She has a history of hypertension in her prior pregnancy with exacerbation of preeclampsia.  Had been placed on Norvasc postpartum, however notes that she has not taken this medication in the past 2 years.  Patient reports that she has a blood pressure cuff at home and checks her blood pressures.  BPs range between 120s/70s to 140s/80s. Jordan Mathis denies issues with headaches, vision changes, shortness of breath, or chest pain.   The following portions of the patient's history were reviewed and updated as appropriate: allergies, current medications, past family history, past medical history, past social history, past surgical history, and problem list.  Review of Systems A comprehensive review of systems was negative.   Objective:   Blood pressure 133/89, pulse 88, height 5\' 3"  (1.6 m), weight 178 lb 1.6 oz (80.8 kg), last menstrual period 08/25/2023, not currently breastfeeding.  Body mass index is 30.47 kg/m.  General appearance: alert and no distress Remainder of exam deferred.   Assessment:   1. Hypertension affecting pregnancy in first trimester   2. History of pre-eclampsia   3. [redacted] weeks gestation of pregnancy      Plan:   -Discussion had with patient regarding history of hypertension in pregnancy and recent mild elevations.  Would advise to initiate BP medication at this time.  Will start Procardia 30 mg daily.  Also discussed hypotension during early parts of pregnancy and giving warning signs.  Patient does have a BP cuff at home and can continue to monitor her  blood pressures throughout the pregnancy.  Advised on need to initiate daily baby aspirin after 12 weeks of pregnancy due to history of pre-eclampsia.   -Patient's is scheduled for new OB intake and ultrasound in the next month.  Recently had labs performed for her annual exam several weeks ago.  Will order her remaining OB labs for her next visit.   A total of 15 minutes were spent face-to-face with the patient during this encounter and over half of that time dealt with counseling and coordination of care.   Hildred Laser, MD [facility] at [facility]

## 2023-10-05 ENCOUNTER — Ambulatory Visit: Payer: Medicaid Other

## 2023-10-19 NOTE — L&D Delivery Note (Signed)
 Obstetrical Delivery Note   Date of Delivery:   05/18/2024 Primary OB:   AOB Gestational Age/EDD: [redacted]w[redacted]d (Dated by LMP) Reason for Admission: IOL for preeclampsia with severe features  Antepartum complications: hypertension, preeclampsia with severe features   Delivered By:   LITTIE Mathis, CNM   Delivery Type:   vaginal birth after cesarean (VBAC)  Delivery Details:   On admission her exam was 1/50/-1, Jordan Mathis was given a.cook's catheter and Pitocin , the catheter came out around 2120, labor progressed linearly from there. She received a labor epidural.  AROM for clear fluid at 0648. She began pushing then. Over the next 2 contractions she gently breathed the baby out to SVB of viable female at [phone]. Infant birthed OA to LOA followed by easy shoulders, nuchal cord noted. Infant unwound from cord once born and laced on mother's abdomen. Vigorous cry and HR>100. Cord doubly clamped and cut by dad once pulsation ceased.  Placenta birth without difficulty. On inspection of the perineum  an intact perineum was found. Infant skin to skin with mom. Both stable.  Anesthesia:    epidural Intrapartum complications: None GBS:    Negative Laceration:    none Episiotomy:    none Rectal exam:   Deferred  Placenta:    Delivered and expressed via active management. Intact: yes. To pathology: no. To be taken home with family  Delayed Cord Clamping: yes Estimated Blood Loss:   Baby:    Liveborn female, APGARs 8/9, weight 2810gm  Jordan Mathis, [name]  [name] 05/18/2024 7:24 AM

## 2023-10-20 ENCOUNTER — Other Ambulatory Visit: Payer: Self-pay

## 2023-10-20 DIAGNOSIS — O3680X0 Pregnancy with inconclusive fetal viability, not applicable or unspecified: Secondary | ICD-10-CM

## 2023-10-20 DIAGNOSIS — N939 Abnormal uterine and vaginal bleeding, unspecified: Secondary | ICD-10-CM

## 2023-10-20 DIAGNOSIS — Z0000 Encounter for general adult medical examination without abnormal findings: Secondary | ICD-10-CM

## 2023-10-20 DIAGNOSIS — Z113 Encounter for screening for infections with a predominantly sexual mode of transmission: Secondary | ICD-10-CM

## 2023-10-20 DIAGNOSIS — Z01419 Encounter for gynecological examination (general) (routine) without abnormal findings: Secondary | ICD-10-CM

## 2023-10-24 ENCOUNTER — Ambulatory Visit (INDEPENDENT_AMBULATORY_CARE_PROVIDER_SITE_OTHER): Payer: Self-pay

## 2023-10-24 DIAGNOSIS — O3680X0 Pregnancy with inconclusive fetal viability, not applicable or unspecified: Secondary | ICD-10-CM

## 2023-10-24 DIAGNOSIS — Z3A08 8 weeks gestation of pregnancy: Secondary | ICD-10-CM

## 2023-10-25 ENCOUNTER — Ambulatory Visit: Payer: Medicaid Other

## 2023-10-27 ENCOUNTER — Telehealth: Payer: Medicaid Other

## 2023-10-31 ENCOUNTER — Telehealth (INDEPENDENT_AMBULATORY_CARE_PROVIDER_SITE_OTHER): Payer: Self-pay

## 2023-10-31 DIAGNOSIS — O0990 Supervision of high risk pregnancy, unspecified, unspecified trimester: Secondary | ICD-10-CM

## 2023-10-31 NOTE — Progress Notes (Signed)
 New OB Intake  I connected with  Jordan Mathis on 10/31/23 at  1:15 PM EST by MyChart Video Visit and verified that I am speaking with the correct person using two identifiers. Nurse is located at [facility] and pt is located at home.  I discussed the limitations, risks, security and privacy concerns of performing an evaluation and management service by telephone and the availability of in person appointments. I also discussed with the patient that there may be a patient responsible charge related to this service. The patient expressed understanding and agreed to proceed.  I explained I am completing New OB Intake today. We discussed her EDD of 05/31/2024 that is based on LMP of 11/072024. Pt is G5/P3 . I reviewed her allergies, medications, Medical/Surgical/OB history, and appropriate screenings. There are no cats in the home in the home .Based on history, this is a/an pregnancy complicated by gestational diabetes and pre-eclampsia . Her obstetrical history is significant for pre-eclampsia.  Patient Active Problem List   Diagnosis Date Noted   Well woman exam 09/05/2023   History of macrosomia in infant in prior pregnancy, currently pregnant 07/08/2021   Desires VBAC (vaginal birth after cesarean) trial 07/08/2021   History of gestational diabetes 07/08/2021   History of pre-eclampsia 07/08/2021   Supervision of high risk pregnancy, antepartum 07/08/2021    Concerns addressed today  Delivery Plans:  Plans to deliver at [facility]  Anatomy US  Explained first scheduled US  will be around 19 weeks.  Labs Discussed genetic screening with patient. Patient would like  genetic testing to be drawn at new OB visit. Discussed possible labs to be drawn at new OB appointment.  COVID Vaccine Patient has had COVID vaccine.   Social Determinants of Health Food Insecurity: denies food insecurity Transportation: Patient denies transportation needs. Childcare:  Discussed no children allowed at ultrasound appointments.   First visit review I reviewed new OB appt with pt. I explained she will have ob bloodwork and pap smear/pelvic exam if indicated. Explained pt will be seen by Dr.cherry  at first visit; encounter routed to appropriate provider.   Jordan Mathis, CMA 10/31/2023  1:48 PM

## 2023-11-18 NOTE — Progress Notes (Signed)
 OBSTETRIC INITIAL PRENATAL VISIT  Subjective:    Jordan Mathis is being seen today for her first obstetrical visit.  This is a planned pregnancy. She is a 38 y.o. [id number] female at [redacted]w[redacted]d gestation, Estimated Date of Delivery: 05/31/24 with Patient's last menstrual period was 08/25/2023.,  consistent with 8 week sono. Her obstetrical history is significant for chronic HTN, history of pre-eclampsia and gestational diabetes . Also has a h/o C-section x 1 followed by successful TOLAC. Relationship with FOB: significant other, living together (not FOB of previous pregnancy). Patient does intend to breast feed. Pregnancy history fully reviewed.  Patient reports that she had to discontinue her blood pressure medication approximately 2 weeks ago.  Medication was making her feel dizzy and feeling like she was going to pass out.  Thinks that her blood pressures may have been dropping too low.  Patient was initiated on Procardia  around 5 to 6 weeks of her pregnancy due to mild elevations at that time.     OB History  Gravida Para Term Preterm AB Living  5 3 3  0 1 3  SAB IAB Ectopic Multiple Live Births  0 1 0 0 3    # Outcome Date GA Lbr Len/2nd Weight Sex Type Anes PTL Lv  5 Current           4 Term 10/20/21 [redacted]w[redacted]d / 00:48 7 lb 0.9 oz (3.2 kg) M Vag-Spont EPI  LIV     Name: [name], [name]     Apgar1: 8  Apgar5: 9  3 Term 10/02/14 [redacted]w[redacted]d  8 lb 10 oz (3.912 kg) F CS-Unspec   LIV     Birth Comments: [facility], C-section due to infant size     Complications: Gestational diabetes mellitus  2 Term 06/24/12 [redacted]w[redacted]d  7 lb 6 oz (3.345 kg) F Vag-Spont   LIV     Birth Comments: [facility]     Complications: Pre-eclampsia, third trimester  1 IAB             Gynecologic History:  Last pap smear was 09/05/2023.  Results were  Abnormal .  Reports h/o abnormal pap smears in the past.  reports history of STIs.  Contraception prior to conception: None   Past Medical History:  Diagnosis Date   Chlamydia  03/2021   GERD (gastroesophageal reflux disease)    History of anemia    History of gestational diabetes mellitus (GDM)    History of pre-eclampsia    History of urinary tract infection     Family History  Problem Relation Age of Onset   Hypertension Mother 17   Hypertension Father    Colon cancer Maternal Grandmother     Past Surgical History:  Procedure Laterality Date   CESAREAN SECTION  10/02/2014    Social History   Socioeconomic History   Marital status: Significant Other    Spouse name: Not on file   Number of children: 3   Years of education: 60 (currently enrolled at [employer] in Nursing Courses)   Highest education level: High school graduate  Occupational History   Occupation: Nurse    Comment: Travel Nurse  Tobacco Use   Smoking status: Never   Smokeless tobacco: Never  Vaping Use   Vaping status: Never Used  Substance and Sexual Activity   Alcohol use: Never    Comment: Last ETOH use end of 05/2020.   Drug use: Never   Sexual activity: Yes  Other Topics Concern   Not on file  Social  History Narrative   ** Merged History Encounter **       Social Drivers of Health   Financial Resource Strain: Low Risk  (10/31/2023)   Overall Financial Resource Strain (CARDIA)    Difficulty of Paying Living Expenses: Not very hard  Food Insecurity: No Food Insecurity (10/31/2023)   Hunger Vital Sign    Worried About Running Out of Food in the Last Year: Never true    Ran Out of Food in the Last Year: Never true  Transportation Needs: No Transportation Needs (10/31/2023)   PRAPARE - Administrator, Civil Service (Medical): No    Lack of Transportation (Non-Medical): No  Physical Activity: Insufficiently Active (10/31/2023)   Exercise Vital Sign    Days of Exercise per Week: 2 days    Minutes of Exercise per Session: 40 min  Stress: No Stress Concern Present (10/31/2023)   [employer] of Occupational Health - Occupational Stress Questionnaire    Feeling  of Stress : Not at all  Social Connections: Moderately Integrated (10/31/2023)   Social Connection and Isolation Panel [NHANES]    Frequency of Communication with Friends and Family: More than three times a week    Frequency of Social Gatherings with Friends and Family: Twice a week    Attends Religious Services: 1 to 4 times per year    Active Member of [employer] or Organizations: No    Attends Banker Meetings: Never    Marital Status: Living with partner  Intimate Partner Violence: Not At Risk (10/31/2023)   Humiliation, Afraid, Rape, and Kick questionnaire    Fear of Current or Ex-Partner: No    Emotionally Abused: No    Physically Abused: No    Sexually Abused: No    Current Outpatient Medications on File Prior to Visit  Medication Sig Dispense Refill   drospirenone -ethinyl estradiol  (YASMIN  28) 3-0.03 MG tablet Take 1 tablet by mouth daily. (Patient not taking: Reported on 10/04/2023) 28 tablet 11   furosemide  (LASIX ) 20 MG tablet Take 1 tablet (20 mg total) by mouth daily. (Patient not taking: Reported on 03/01/2023) 5 tablet 0   medroxyPROGESTERone  (PROVERA ) 10 MG tablet Take 1 tablet (10 mg total) by mouth daily. (Patient not taking: Reported on 10/04/2023) 10 tablet 0   NIFEdipine  (PROCARDIA -XL/NIFEDICAL-XL) 30 MG 24 hr tablet Take 1 tablet (30 mg total) by mouth daily. Can increase to twice a day as needed for symptomatic contractions 90 tablet 3   pantoprazole  (PROTONIX ) 20 MG tablet Take 1 tablet (20 mg total) by mouth daily. (Patient not taking: Reported on 09/05/2023) 30 tablet 0   Prenatal Vit-Fe Fumarate-FA (MULTIVITAMIN-PRENATAL) 27-0.8 MG TABS tablet Take 1 tablet by mouth daily at 12 noon. 30 tablet 11   sucralfate  (CARAFATE ) 1 g tablet Take 1 tablet (1 g total) by mouth 4 (four) times daily -  with meals and at bedtime. (Patient not taking: Reported on 09/05/2023) 42 tablet 0   No current facility-administered medications on file prior to visit.    Allergies   Allergen Reactions   Amoxicillin Hives   Penicillins Hives   Penicillins Hives     Review of Systems General: Not Present- Fever, Weight Loss and Weight Gain. Skin: Not Present- Rash. HEENT: Not Present- Blurred Vision, Headache and Bleeding Gums. Respiratory: Not Present- Difficulty Breathing. Breast: Not Present- Breast Mass. Cardiovascular: Not Present- Chest Pain, Elevated Blood Pressure, Fainting / Blacking Out and Shortness of Breath. Gastrointestinal: Not Present- Abdominal Pain, Constipation, Nausea and Vomiting. Female  Genitourinary: Not Present- Frequency, Painful Urination, Pelvic Pain, Vaginal Bleeding, Vaginal Discharge, Contractions, regular, Fetal Movements Decreased, Urinary Complaints and Vaginal Fluid. Musculoskeletal: Not Present- Back Pain and Leg Cramps. Neurological: Not Present- Dizziness. Psychiatric: Not Present- Depression.     Objective:   Blood pressure 117/76, pulse 84, weight 183 lb 1.6 oz (83.1 kg), last menstrual period 08/25/2023. Body mass index is 32.43 kg/m.  General Appearance:    Alert, cooperative, no distress, appears stated age, mild obesity  Head:    Normocephalic, without obvious abnormality, atraumatic  Eyes:    PERRL, conjunctiva/corneas clear, EOM's intact, both eyes  Ears:    Normal external ear canals, both ears  Nose:   Nares normal, septum midline, mucosa normal, no drainage or sinus tenderness  Throat:   Lips, mucosa, and tongue normal; teeth and gums normal  Neck:   Supple, symmetrical, trachea midline, no adenopathy; thyroid: no enlargement/tenderness/nodules; no carotid bruit or JVD  Back:     Symmetric, no curvature, ROM normal, no CVA tenderness  Lungs:     Clear to auscultation bilaterally, respirations unlabored  Chest Wall:    No tenderness or deformity   Heart:    Regular rate and rhythm, S1 and S2 normal, no murmur, rub or gallop  Breast Exam:    No tenderness, masses, or nipple abnormality  Abdomen:     Soft,  non-tender, bowel sounds active all four quadrants, no masses, no organomegaly.  [id number] bpm.  Genitalia:    Pelvic:external genitalia normal, vagina without lesions, discharge, or tenderness, rectovaginal septum  normal. Cervix normal in appearance, no cervical motion tenderness, no adnexal masses or tenderness.  Pregnancy positive findings: uterine enlargement: 13 wk size, nontender.   Rectal:    Normal external sphincter.  No hemorrhoids appreciated. Internal exam not done.   Extremities:   Extremities normal, atraumatic, no cyanosis or edema  Pulses:   2+ and symmetric all extremities  Skin:   Skin color, texture, turgor normal, no rashes or lesions  Lymph nodes:   Cervical, supraclavicular, and axillary nodes normal  Neurologic:   CNII-XII intact, normal strength, sensation and reflexes throughout     Assessment:   1. Supervision of high-risk pregnancy of elderly multigravida   2. Hypertension affecting pregnancy in first trimester   3. History of pre-eclampsia   4. Encounter for fetal anatomic survey   5. History of macrosomia in infant in prior pregnancy, currently pregnant   6. Desires VBAC (vaginal birth after cesarean) trial   7. History of gestational diabetes     Plan:   Supervision of high risk pregnancy in elderly multigravida - Initial labs reviewed. - Prenatal vitamins encouraged. - Problem list reviewed and updated. - New OB counseling:  The patient has been given an overview regarding routine prenatal care.  Recommendations regarding diet, weight gain, and exercise in pregnancy were given. - Prenatal testing, optional genetic testing, and ultrasound use in pregnancy were reviewed.  Traditional genetic screening vs cell-fee DNA genetic screening discussed, including risks and benefits. Testing ordered ([facility]or[facility]). - Benefits of Breast Feeding were discussed. The patient is encouraged to consider nursing her baby post partum. - The patient has Medicaid.  [facility] Medicaid Risk  Screening Form completed today   2. Hypertension affecting pregnancy in first trimester - Initiated on Nifedipine  at beginning of pregnancy but experiencing hypotension symptoms. Meds now currently holding. BPs normal today, can continue to hold meds for now and monitor BP during the pregnancy.  - Baseline PIH labs ordered.  -  Advised on initiation of daily baby aspirin  81 mg, for hopeful prevention of pre-eclampsia.   3. History of pre-eclampsia - Baseline PIH labs ordered.  - Advised on initiation of daily baby aspirin  81 mg, for hopeful prevention of pre-eclampsia.   4. Encounter for fetal anatomic survey - US  OB Comp + 14 Wk; Future  5. History of macrosomia in infant in prior pregnancy, currently pregnant - Patient with LGA infant/borderline macrosomia in second pregnancy. Likely secondary to GDM during that pregnancy Subsequent pregnancy with normal fetal growth.   6. Desires VBAC (vaginal birth after cesarean) trial - Patient with h/o C-section of second pregnancy, likely secondary to CPD (larger infant). Has had subsequent successful VBAC. Desires trial of labor again this pregnancy. Is a good candidate.    7. History of gestational diabetes - Patient with h/o GDM in second pregnancy.  Will check HgbA1c, and if elevated, proceed to early 1 hr glucola.    Follow up in 4 weeks.   Archie Savers, MD [facility]

## 2023-11-22 ENCOUNTER — Encounter: Payer: Self-pay | Admitting: Obstetrics and Gynecology

## 2023-11-22 ENCOUNTER — Other Ambulatory Visit (HOSPITAL_COMMUNITY)
Admission: RE | Admit: 2023-11-22 | Discharge: 2023-11-22 | Disposition: A | Discharge status: Home / Self Care | Payer: Self-pay | Source: Ambulatory Visit | Attending: Obstetrics and Gynecology | Admitting: Obstetrics and Gynecology

## 2023-11-22 ENCOUNTER — Ambulatory Visit (INDEPENDENT_AMBULATORY_CARE_PROVIDER_SITE_OTHER): Payer: Self-pay | Admitting: Obstetrics and Gynecology

## 2023-11-22 VITALS — BP 117/76 | HR 84 | Wt 183.1 lb

## 2023-11-22 DIAGNOSIS — O161 Unspecified maternal hypertension, first trimester: Secondary | ICD-10-CM

## 2023-11-22 DIAGNOSIS — Z3A12 12 weeks gestation of pregnancy: Secondary | ICD-10-CM

## 2023-11-22 DIAGNOSIS — O34219 Maternal care for unspecified type scar from previous cesarean delivery: Secondary | ICD-10-CM | POA: Diagnosis not present

## 2023-11-22 DIAGNOSIS — Z8632 Personal history of gestational diabetes: Secondary | ICD-10-CM

## 2023-11-22 DIAGNOSIS — Z3689 Encounter for other specified antenatal screening: Secondary | ICD-10-CM

## 2023-11-22 DIAGNOSIS — O09521 Supervision of elderly multigravida, first trimester: Secondary | ICD-10-CM

## 2023-11-22 DIAGNOSIS — O0990 Supervision of high risk pregnancy, unspecified, unspecified trimester: Secondary | ICD-10-CM | POA: Insufficient documentation

## 2023-11-22 DIAGNOSIS — O09299 Supervision of pregnancy with other poor reproductive or obstetric history, unspecified trimester: Secondary | ICD-10-CM

## 2023-11-22 DIAGNOSIS — O09529 Supervision of elderly multigravida, unspecified trimester: Secondary | ICD-10-CM

## 2023-11-22 DIAGNOSIS — O09291 Supervision of pregnancy with other poor reproductive or obstetric history, first trimester: Secondary | ICD-10-CM

## 2023-11-22 DIAGNOSIS — Z8759 Personal history of other complications of pregnancy, childbirth and the puerperium: Secondary | ICD-10-CM

## 2023-11-22 MED ORDER — ASPIRIN 81 MG PO TBEC: 81.0000 mg | DELAYED_RELEASE_TABLET | Freq: Every day | ORAL | 2 refills | Status: DC

## 2023-11-22 NOTE — Patient Instructions (Signed)
 WHAT OB PATIENTS CAN EXPECT  Confirmation of pregnancy and ultrasound ordered if medically indicated-[redacted] weeks gestation New OB (NOB) intake with nurse and New OB (NOB) labs- [redacted] weeks gestation New OB (NOB) physical examination with provider- 11/[redacted] weeks gestation Flu vaccine-[redacted] weeks gestation Anatomy scan-[redacted] weeks gestation Glucose tolerance test, blood work to test for anemia, T-dap vaccine-[redacted] weeks gestation Vaginal swabs/cultures-STD/Group B strep-[redacted] weeks gestation Appointments every 4 weeks until 28 weeks Every 2 weeks from 28 weeks until 36 weeks Weekly visits from 36 weeks until delivery

## 2023-11-23 LAB — URINALYSIS, ROUTINE W REFLEX MICROSCOPIC
Bilirubin, UA: NEGATIVE
Glucose, UA: NEGATIVE
Ketones, UA: NEGATIVE
Leukocytes,UA: NEGATIVE
Nitrite, UA: NEGATIVE
RBC, UA: NEGATIVE
Specific Gravity, UA: 1.027 (ref 1.005–1.030)
Urobilinogen, Ur: 1 mg/dL (ref 0.2–1.0)
pH, UA: 7 (ref 5.0–7.5)

## 2023-11-24 LAB — COMPREHENSIVE METABOLIC PANEL WITH GFR
ALT: 12 IU/L (ref 0–32)
AST: 14 IU/L (ref 0–40)
Albumin: 4 g/dL (ref 3.9–4.9)
Alkaline Phosphatase: 66 IU/L (ref 44–121)
BUN/Creatinine Ratio: 13 (ref 9–23)
BUN: 8 mg/dL (ref 6–20)
Bilirubin Total: 0.3 mg/dL (ref 0.0–1.2)
CO2: 22 mmol/L (ref 20–29)
Calcium: 8.9 mg/dL (ref 8.7–10.2)
Chloride: 104 mmol/L (ref 96–106)
Creatinine, Ser: 0.6 mg/dL (ref 0.57–1.00)
Globulin, Total: 3.1 g/dL (ref 1.5–4.5)
Glucose: 73 mg/dL (ref 70–99)
Potassium: 3.6 mmol/L (ref 3.5–5.2)
Sodium: 141 mmol/L (ref 134–144)
Total Protein: 7.1 g/dL (ref 6.0–8.5)
eGFR: 120 mL/min/1.73

## 2023-11-24 LAB — CBC/D/PLT+RPR+RH+ABO+RUBIGG...
Antibody Screen: NEGATIVE
Basophils Absolute: 0 10*3/uL (ref 0.0–0.2)
Basos: 0 %
EOS (ABSOLUTE): 0.1 10*3/uL (ref 0.0–0.4)
Eos: 2 %
HCV Ab: NONREACTIVE
HIV Screen 4th Generation wRfx: NONREACTIVE
Hematocrit: 33.5 % — ABNORMAL LOW (ref 34.0–46.6)
Hemoglobin: 10.7 g/dL — ABNORMAL LOW (ref 11.1–15.9)
Hepatitis B Surface Ag: NEGATIVE
Immature Grans (Abs): 0 10*3/uL (ref 0.0–0.1)
Immature Granulocytes: 0 %
Lymphocytes Absolute: 2.9 10*3/uL (ref 0.7–3.1)
Lymphs: 40 %
MCH: 27.4 pg (ref 26.6–33.0)
MCHC: 31.9 g/dL (ref 31.5–35.7)
MCV: 86 fL (ref 79–97)
Monocytes Absolute: 0.5 10*3/uL (ref 0.1–0.9)
Monocytes: 7 %
Neutrophils Absolute: 3.7 10*3/uL (ref 1.4–7.0)
Neutrophils: 51 %
Platelets: 307 10*3/uL (ref 150–450)
RBC: 3.9 x10E6/uL (ref 3.77–5.28)
RDW: 12.8 % (ref 11.7–15.4)
RPR Ser Ql: NONREACTIVE
Rh Factor: POSITIVE
Rubella Antibodies, IGG: <0.9 {index} — ABNORMAL LOW (ref >0.99)
Varicella zoster IgG: REACTIVE
WBC: 7.3 10*3/uL (ref 3.4–10.8)

## 2023-11-24 LAB — MONITOR DRUG PROFILE 14(MW)
Amphetamine Scrn, Ur: NEGATIVE ng/mL
BARBITURATE SCREEN URINE: NEGATIVE ng/mL
BENZODIAZEPINE SCREEN, URINE: NEGATIVE ng/mL
Buprenorphine, Urine: NEGATIVE ng/mL
CANNABINOIDS UR QL SCN: NEGATIVE ng/mL
Cocaine (Metab) Scrn, Ur: NEGATIVE ng/mL
Creatinine(Crt), U: 154.6 mg/dL (ref 20.0–300.0)
Fentanyl, Urine: NEGATIVE pg/mL
Meperidine Screen, Urine: NEGATIVE ng/mL
Methadone Screen, Urine: NEGATIVE ng/mL
OXYCODONE+OXYMORPHONE UR QL SCN: NEGATIVE ng/mL
Opiate Scrn, Ur: NEGATIVE ng/mL
Ph of Urine: 6.9 (ref 4.5–8.9)
Phencyclidine Qn, Ur: NEGATIVE ng/mL
Propoxyphene Scrn, Ur: NEGATIVE ng/mL
SPECIFIC GRAVITY: 1.024
Tramadol Screen, Urine: NEGATIVE ng/mL

## 2023-11-24 LAB — URINE CULTURE, OB REFLEX

## 2023-11-24 LAB — HCV INTERPRETATION

## 2023-11-24 LAB — PROTEIN / CREATININE RATIO, URINE
Creatinine, Urine: 155.2 mg/dL
Protein, Ur: 14.4 mg/dL
Protein/Creat Ratio: 93 mg/g{creat} (ref 0–200)

## 2023-11-24 LAB — HGB FRACTIONATION CASCADE
Hgb A2: 2.8 % (ref 1.8–3.2)
Hgb A: 97.2 % (ref 96.4–98.8)
Hgb F: 0 % (ref 0.0–2.0)
Hgb S: 0 %

## 2023-11-24 LAB — CULTURE, OB URINE

## 2023-11-24 LAB — CERVICOVAGINAL ANCILLARY ONLY
Chlamydia: NEGATIVE
Comment: NEGATIVE
Comment: NORMAL
Neisseria Gonorrhea: NEGATIVE

## 2023-11-24 LAB — NICOTINE SCREEN, URINE: Cotinine Ql Scrn, Ur: NEGATIVE ng/mL

## 2023-11-25 ENCOUNTER — Encounter: Payer: Self-pay | Admitting: Obstetrics and Gynecology

## 2023-12-04 LAB — PANORAMA PRENATAL TEST FULL PANEL:PANORAMA TEST PLUS 5 ADDITIONAL MICRODELETIONS: FETAL FRACTION: 5.9

## 2023-12-20 ENCOUNTER — Ambulatory Visit (INDEPENDENT_AMBULATORY_CARE_PROVIDER_SITE_OTHER): Payer: Self-pay | Admitting: Certified Nurse Midwife

## 2023-12-20 VITALS — BP 118/66 | HR 89 | Wt 181.1 lb

## 2023-12-20 DIAGNOSIS — Z3A16 16 weeks gestation of pregnancy: Secondary | ICD-10-CM

## 2023-12-20 DIAGNOSIS — O162 Unspecified maternal hypertension, second trimester: Secondary | ICD-10-CM

## 2023-12-20 DIAGNOSIS — O09892 Supervision of other high risk pregnancies, second trimester: Secondary | ICD-10-CM

## 2023-12-20 DIAGNOSIS — O161 Unspecified maternal hypertension, first trimester: Secondary | ICD-10-CM

## 2023-12-20 DIAGNOSIS — Z8632 Personal history of gestational diabetes: Secondary | ICD-10-CM

## 2023-12-20 DIAGNOSIS — Z363 Encounter for antenatal screening for malformations: Secondary | ICD-10-CM

## 2023-12-20 DIAGNOSIS — Z1379 Encounter for other screening for genetic and chromosomal anomalies: Secondary | ICD-10-CM

## 2023-12-20 DIAGNOSIS — Z131 Encounter for screening for diabetes mellitus: Secondary | ICD-10-CM

## 2023-12-20 DIAGNOSIS — O09891 Supervision of other high risk pregnancies, first trimester: Secondary | ICD-10-CM

## 2023-12-20 DIAGNOSIS — O09529 Supervision of elderly multigravida, unspecified trimester: Secondary | ICD-10-CM

## 2023-12-20 LAB — POCT URINALYSIS DIPSTICK
Bilirubin, UA: NEGATIVE
Blood, UA: NEGATIVE
Glucose, UA: NEGATIVE
Ketones, UA: NEGATIVE
Leukocytes, UA: NEGATIVE
Nitrite, UA: NEGATIVE
Protein, UA: NEGATIVE
Spec Grav, UA: 1.02 (ref 1.010–1.025)
Urobilinogen, UA: 0.2 U/dL
pH, UA: 6.5 (ref 5.0–8.0)

## 2023-12-20 NOTE — Assessment & Plan Note (Signed)
 Normotensive today, baseline labs were complete with NOB panel.

## 2023-12-20 NOTE — Assessment & Plan Note (Signed)
-  A1c today

## 2023-12-20 NOTE — Progress Notes (Signed)
    Return Prenatal Note   Subjective   38 y.o. [id number] at [redacted]w[redacted]d presents for this follow-up prenatal visit.  Patient feeling well, has felt some flutters Patient reports: Movement: Present Contractions: Not present  Objective   Flow sheet Vitals: Pulse Rate: 89 BP: 118/66 Fundal Height:  (58fb below U) Fetal Heart Rate (bpm): 155 Total weight gain: 9 lb 1.6 oz (4.128 kg)  General Appearance  No acute distress, well appearing, and well nourished Pulmonary   Normal work of breathing Neurologic   Alert and oriented to person, place, and time Psychiatric   Mood and affect within normal limits  Assessment/Plan   Plan  38 y.o. [id number] at [redacted]w[redacted]d presents for follow-up OB visit. Reviewed prenatal record including previous visit note.  Hypertension affecting pregnancy in first trimester Normotensive today, baseline labs were complete with NOB panel.  History of gestational diabetes A1c today.  Supervision of other high risk pregnancies, first trimester Red flag symptoms reviewed. AFP today. Considering transfer of care, will be moving to [place].      Orders Placed This Encounter  Procedures   AFP, Serum, Open Spina Bifida    Is patient insulin dependent?:   No    Weight (lbs):   181    Gestational Age (GA), weeks:   71    Date on which patient was at this GA:   12/15/2023    GA Calculation Method:   Ultrasound    Number of fetuses:   1    Donor egg?:   N   HgB A1c   POCT Urinalysis Dipstick   Return in about 4 weeks (around 01/17/2024) for ROB, anatomy ultrasound.   Future Appointments  Date Time Provider Department Center  01/17/2024  8:15 AM [facility] [place] 1 [facility] None    For next visit:  continue with routine prenatal care     Dominica Severin, CNM  03/04/258:44 AM

## 2023-12-20 NOTE — Patient Instructions (Signed)
 Second Trimester of Pregnancy  The second trimester of pregnancy is from week 14 through week 27. This is months 4 through 6 of pregnancy. During the second trimester: Morning sickness is less or has stopped. You may have more energy. You may feel hungry more often. At this time, your unborn baby is growing very fast. At the end of the sixth month, the unborn baby may be up to 12 inches long and weigh about 1 pounds. You will likely start to feel the baby move between 16 and 20 weeks of pregnancy. Body changes during your second trimester Your body continues to change during this time. The changes usually go away after your baby is born. Physical changes You will gain more weight. Your belly will get bigger. You may begin to get stretch marks on your hips, belly, and breasts. Your breasts will keep growing and may hurt. You may get dark spots or blotches on your face. A dark line from your belly button to the pubic area may appear. This line is called linea nigra. Your hair may grow faster and get thicker. Health changes You may have headaches. You may have heartburn. You may pee more often. You may have swollen, bulging veins (varicose veins). You may have trouble pooping (constipation), or swollen veins in the butt that can itch or get painful (hemorrhoids). You may have back pain. This is caused by: Weight gain. Pregnancy hormones that are relaxing the joints in your pelvis. Follow these instructions at home: Medicines Talk to your health care provider if you're taking medicines. Ask if the medicines are safe to take during pregnancy. Your provider may change the medicines that you take. Do not take any medicines unless told to by your provider. Take a prenatal vitamin that has at least 600 micrograms (mcg) of folic acid. Do not use herbal medicines, illegal drugs, or medicines that are not approved by your provider. Eating and drinking While you're pregnant your body needs  extra food for your growing baby. Talk with your provider about what to eat while pregnant. Activity Most women are able to exercise during pregnancy. Exercises may need to change as your pregnancy goes on. Talk to your provider about your activities and exercise routines. Relieving pain and discomfort Wear a good, supportive bra if your breasts hurt. Rest with your legs raised if you have leg cramps or low back pain. Take warm sitz baths to soothe pain from hemorrhoids. Use hemorrhoid cream if your provider says it's okay. Do not douche. Do not use tampons or scented pads. Do not use hot tubs, steam rooms, or saunas. Safety Wear your seatbelt at all times when you're in a car. Talk to your provider if someone hits you, hurts you, or yells at you. Talk with your provider if you're feeling sad or have thoughts of hurting yourself. Lifestyle Certain things can be harmful while you're pregnant. It's best to avoid the following: Do not drink alcohol,smoke, vape, or use products with nicotine or tobacco in them. If you need help quitting, talk with your provider. Avoid cat litter boxes and soil used by cats. These things carry germs that can cause harm to your pregnancy and your baby. General instructions Keep all follow-up visits. It helps you and your unborn baby stay as healthy as possible. Write down your questions. Take them to your prenatal visits. Your provider will: Talk with you about your overall health. Give you advice or refer you to specialists who can help with different needs,  including: Prenatal education classes. Mental health and counseling. Foods and healthy eating. Ask for help if you need help with food. Where to find more information American Pregnancy Association: americanpregnancy.org [employer] of Obstetricians and Gynecologists: acog.org Office on [employer] Health: [employer] Contact a health care provider if: You have a headache that does not go away  when you take medicine. You have any of these problems: You can't eat or drink. You throw up or feel like you may throw up. You have watery poop (diarrhea) for 2 days or more. You have pain when you pee or your pee smells bad. You have been sick for 2 days or more and are not getting better. Contact your provider right away if: You have any of these coming from your vagina: Abnormal discharge. Bad-smelling fluid. Bleeding. Your baby is moving less than usual. You have contractions, belly cramping, or have pain in your pelvis or lower back. You have symptoms of high blood pressure or preeclampsia. These include: A severe, throbbing headache that does not go away. Sudden or extreme swelling of your face, hands, legs, or feet. Vision problems: You see spots. You have blurry vision. Your eyes are sensitive to light. If you can't reach the provider, go to an urgent care or emergency room. Get help right away if: You faint, become confused, or can't think clearly. You have chest pain or trouble breathing. You have any kind of injury, such as from a fall or a car crash. These symptoms may be an emergency. Call 911 right away. Do not wait to see if the symptoms will go away. Do not drive yourself to the hospital. This information is not intended to replace advice given to you by your health care provider. Make sure you discuss any questions you have with your health care provider. Document Revised: 07/07/2023 Document Reviewed: 02/04/2023 Elsevier Patient Education  2024 [employer].

## 2023-12-20 NOTE — Assessment & Plan Note (Signed)
 Red flag symptoms reviewed. AFP today. Considering transfer of care, will be moving to [place].

## 2023-12-22 LAB — AFP, SERUM, OPEN SPINA BIFIDA
AFP MoM: 1.1
AFP Value: 39.1 ng/mL
Gest. Age on Collection Date: 16.7 wk
Maternal Age At EDD: 36.4 a
OSBR Risk 1 IN: 10000
Test Results:: NEGATIVE
Weight: 181 [lb_av]

## 2023-12-22 LAB — HEMOGLOBIN A1C
Est. average glucose Bld gHb Est-mCnc: 105 mg/dL
Hgb A1c MFr Bld: 5.3 % (ref 4.8–5.6)

## 2023-12-26 ENCOUNTER — Encounter: Payer: Self-pay | Admitting: Certified Nurse Midwife

## 2024-01-17 ENCOUNTER — Other Ambulatory Visit: Payer: Medicaid Other

## 2024-01-17 ENCOUNTER — Ambulatory Visit (INDEPENDENT_AMBULATORY_CARE_PROVIDER_SITE_OTHER): Admitting: Certified Nurse Midwife

## 2024-01-17 ENCOUNTER — Encounter: Payer: Self-pay | Admitting: Certified Nurse Midwife

## 2024-01-17 ENCOUNTER — Ambulatory Visit

## 2024-01-17 VITALS — BP 120/77 | HR 77 | Wt 182.6 lb

## 2024-01-17 DIAGNOSIS — O09522 Supervision of elderly multigravida, second trimester: Secondary | ICD-10-CM | POA: Diagnosis not present

## 2024-01-17 DIAGNOSIS — O09529 Supervision of elderly multigravida, unspecified trimester: Secondary | ICD-10-CM

## 2024-01-17 DIAGNOSIS — O0992 Supervision of high risk pregnancy, unspecified, second trimester: Secondary | ICD-10-CM | POA: Diagnosis not present

## 2024-01-17 DIAGNOSIS — Z3A20 20 weeks gestation of pregnancy: Secondary | ICD-10-CM

## 2024-01-17 DIAGNOSIS — O09891 Supervision of other high risk pregnancies, first trimester: Secondary | ICD-10-CM

## 2024-01-17 DIAGNOSIS — Z363 Encounter for antenatal screening for malformations: Secondary | ICD-10-CM

## 2024-01-17 DIAGNOSIS — Z3689 Encounter for other specified antenatal screening: Secondary | ICD-10-CM

## 2024-01-17 DIAGNOSIS — O0990 Supervision of high risk pregnancy, unspecified, unspecified trimester: Secondary | ICD-10-CM

## 2024-01-17 DIAGNOSIS — O99012 Anemia complicating pregnancy, second trimester: Secondary | ICD-10-CM

## 2024-01-17 NOTE — Progress Notes (Signed)
    Return Prenatal Note   Subjective   [name] at [redacted]w[redacted]d presents for this follow-up prenatal visit.  Patient reports sciatic pain, frequent dizzy spells. Patient reports: Movement: Present Contractions: Not present  Objective   Flow sheet Vitals: Pulse Rate: 77 BP: 120/77 Fundal Height:  (@U ) Fetal Heart Rate (bpm): 150 Total weight gain: 10 lb 9.6 oz (4.808 kg)  General Appearance  No acute distress, well appearing, and well nourished Pulmonary   Normal work of breathing Neurologic   Alert and oriented to person, place, and time Psychiatric   Mood and affect within normal limits  Assessment/Plan   Plan  38 y.o. [id number] at [redacted]w[redacted]d presents for follow-up OB visit. Reviewed prenatal record including previous visit note.  Supervision of high risk pregnancy, antepartum Red flag symptoms reviewed. Comfort measures for sciatica-support belt, stretching discussed. Dizzy spells happening almost daily, CBC & iron studies today.      Orders Placed This Encounter  Procedures   US OB Follow Up    Standing Status:   Future    Expected Date:   02/16/2024    Expiration Date:   04/17/2024    Reason for exam::   follow up heart views, incomplete anatomy    Preferred imaging location?:   Internal   CBC   Fe+TIBC+Fer    Return in 4 weeks (on 02/14/2024) for ROB, follow up anatomy ultrasound.   Future Appointments  Date Time Provider Department Center  02/16/2024  8:15 AM [facility] [place] [facility] None  02/16/2024  9:35 AM Logan Bores Ellsworth Lennox, MD [facility] None    For next visit:  continue with routine prenatal care     Dominica Severin, CNM  01/17/2511:27 PM

## 2024-01-17 NOTE — Patient Instructions (Signed)
 Second Trimester of Pregnancy  The second trimester of pregnancy is from week 14 through week 27. This is months 4 through 6 of pregnancy. During the second trimester: Morning sickness is less or has stopped. You may have more energy. You may feel hungry more often. At this time, your unborn baby is growing very fast. At the end of the sixth month, the unborn baby may be up to 12 inches long and weigh about 1 pounds. You will likely start to feel the baby move between 16 and 20 weeks of pregnancy. Body changes during your second trimester Your body continues to change during this time. The changes usually go away after your baby is born. Physical changes You will gain more weight. Your belly will get bigger. You may begin to get stretch marks on your hips, belly, and breasts. Your breasts will keep growing and may hurt. You may get dark spots or blotches on your face. A dark line from your belly button to the pubic area may appear. This line is called linea nigra. Your hair may grow faster and get thicker. Health changes You may have headaches. You may have heartburn. You may pee more often. You may have swollen, bulging veins (varicose veins). You may have trouble pooping (constipation), or swollen veins in the butt that can itch or get painful (hemorrhoids). You may have back pain. This is caused by: Weight gain. Pregnancy hormones that are relaxing the joints in your pelvis. Follow these instructions at home: Medicines Talk to your health care provider if you're taking medicines. Ask if the medicines are safe to take during pregnancy. Your provider may change the medicines that you take. Do not take any medicines unless told to by your provider. Take a prenatal vitamin that has at least 600 micrograms (mcg) of folic acid. Do not use herbal medicines, illegal drugs, or medicines that are not approved by your provider. Eating and drinking While you're pregnant your body needs  extra food for your growing baby. Talk with your provider about what to eat while pregnant. Activity Most women are able to exercise during pregnancy. Exercises may need to change as your pregnancy goes on. Talk to your provider about your activities and exercise routines. Relieving pain and discomfort Wear a good, supportive bra if your breasts hurt. Rest with your legs raised if you have leg cramps or low back pain. Take warm sitz baths to soothe pain from hemorrhoids. Use hemorrhoid cream if your provider says it's okay. Do not douche. Do not use tampons or scented pads. Do not use hot tubs, steam rooms, or saunas. Safety Wear your seatbelt at all times when you're in a car. Talk to your provider if someone hits you, hurts you, or yells at you. Talk with your provider if you're feeling sad or have thoughts of hurting yourself. Lifestyle Certain things can be harmful while you're pregnant. It's best to avoid the following: Do not drink alcohol,smoke, vape, or use products with nicotine or tobacco in them. If you need help quitting, talk with your provider. Avoid cat litter boxes and soil used by cats. These things carry germs that can cause harm to your pregnancy and your baby. General instructions Keep all follow-up visits. It helps you and your unborn baby stay as healthy as possible. Write down your questions. Take them to your prenatal visits. Your provider will: Talk with you about your overall health. Give you advice or refer you to specialists who can help with different needs,  including: Prenatal education classes. Mental health and counseling. Foods and healthy eating. Ask for help if you need help with food. Where to find more information American Pregnancy Association: americanpregnancy.org [employer] of Obstetricians and Gynecologists: acog.org Office on [employer] Health: [employer] Contact a health care provider if: You have a headache that does not go away  when you take medicine. You have any of these problems: You can't eat or drink. You throw up or feel like you may throw up. You have watery poop (diarrhea) for 2 days or more. You have pain when you pee or your pee smells bad. You have been sick for 2 days or more and are not getting better. Contact your provider right away if: You have any of these coming from your vagina: Abnormal discharge. Bad-smelling fluid. Bleeding. Your baby is moving less than usual. You have contractions, belly cramping, or have pain in your pelvis or lower back. You have symptoms of high blood pressure or preeclampsia. These include: A severe, throbbing headache that does not go away. Sudden or extreme swelling of your face, hands, legs, or feet. Vision problems: You see spots. You have blurry vision. Your eyes are sensitive to light. If you can't reach the provider, go to an urgent care or emergency room. Get help right away if: You faint, become confused, or can't think clearly. You have chest pain or trouble breathing. You have any kind of injury, such as from a fall or a car crash. These symptoms may be an emergency. Call 911 right away. Do not wait to see if the symptoms will go away. Do not drive yourself to the hospital. This information is not intended to replace advice given to you by your health care provider. Make sure you discuss any questions you have with your health care provider. Document Revised: 07/07/2023 Document Reviewed: 02/04/2023 Elsevier Patient Education  2024 [employer].

## 2024-01-17 NOTE — Assessment & Plan Note (Signed)
 Red flag symptoms reviewed. Comfort measures for sciatica-support belt, stretching discussed. Dizzy spells happening almost daily, CBC & iron studies today.

## 2024-01-18 ENCOUNTER — Encounter: Payer: Self-pay | Admitting: Certified Nurse Midwife

## 2024-01-18 LAB — IRON,TIBC AND FERRITIN PANEL
Ferritin: 33 ng/mL (ref 15–150)
Iron Saturation: 17 % (ref 15–55)
Iron: 84 ug/dL (ref 27–159)
Total Iron Binding Capacity: 487 ug/dL — ABNORMAL HIGH (ref 250–450)
UIBC: 403 ug/dL (ref 131–425)

## 2024-01-18 LAB — CBC
Hematocrit: 32.5 % — ABNORMAL LOW (ref 34.0–46.6)
Hemoglobin: 10.6 g/dL — ABNORMAL LOW (ref 11.1–15.9)
MCH: 27.7 pg (ref 26.6–33.0)
MCHC: 32.6 g/dL (ref 31.5–35.7)
MCV: 85 fL (ref 79–97)
Platelets: 256 10*3/uL (ref 150–450)
RBC: 3.83 x10E6/uL (ref 3.77–5.28)
RDW: 12.5 % (ref 11.7–15.4)
WBC: 8.1 10*3/uL (ref 3.4–10.8)

## 2024-01-29 ENCOUNTER — Encounter: Payer: Self-pay | Admitting: Obstetrics and Gynecology

## 2024-01-29 ENCOUNTER — Other Ambulatory Visit: Payer: Self-pay | Admitting: Obstetrics and Gynecology

## 2024-02-13 ENCOUNTER — Other Ambulatory Visit: Payer: Self-pay | Admitting: Certified Nurse Midwife

## 2024-02-13 DIAGNOSIS — O0990 Supervision of high risk pregnancy, unspecified, unspecified trimester: Secondary | ICD-10-CM

## 2024-02-13 DIAGNOSIS — Z363 Encounter for antenatal screening for malformations: Secondary | ICD-10-CM

## 2024-02-13 DIAGNOSIS — O09522 Supervision of elderly multigravida, second trimester: Secondary | ICD-10-CM

## 2024-02-13 DIAGNOSIS — Z3A20 20 weeks gestation of pregnancy: Secondary | ICD-10-CM

## 2024-02-13 DIAGNOSIS — O10912 Unspecified pre-existing hypertension complicating pregnancy, second trimester: Secondary | ICD-10-CM

## 2024-02-13 DIAGNOSIS — O99012 Anemia complicating pregnancy, second trimester: Secondary | ICD-10-CM

## 2024-02-13 DIAGNOSIS — Z362 Encounter for other antenatal screening follow-up: Secondary | ICD-10-CM

## 2024-02-16 ENCOUNTER — Encounter: Admitting: Certified Nurse Midwife

## 2024-02-16 ENCOUNTER — Telehealth: Payer: Self-pay | Admitting: Certified Nurse Midwife

## 2024-02-16 ENCOUNTER — Encounter: Admitting: Obstetrics and Gynecology

## 2024-02-16 ENCOUNTER — Ambulatory Visit

## 2024-02-16 DIAGNOSIS — O10012 Pre-existing essential hypertension complicating pregnancy, second trimester: Secondary | ICD-10-CM | POA: Diagnosis not present

## 2024-02-16 DIAGNOSIS — Z362 Encounter for other antenatal screening follow-up: Secondary | ICD-10-CM

## 2024-02-16 DIAGNOSIS — O161 Unspecified maternal hypertension, first trimester: Secondary | ICD-10-CM

## 2024-02-16 DIAGNOSIS — Z130 Encounter for screening for diseases of the blood and blood-forming organs and certain disorders involving the immune mechanism: Secondary | ICD-10-CM

## 2024-02-16 DIAGNOSIS — O09522 Supervision of elderly multigravida, second trimester: Secondary | ICD-10-CM | POA: Diagnosis not present

## 2024-02-16 DIAGNOSIS — Z131 Encounter for screening for diabetes mellitus: Secondary | ICD-10-CM

## 2024-02-16 DIAGNOSIS — Z3A24 24 weeks gestation of pregnancy: Secondary | ICD-10-CM

## 2024-02-16 DIAGNOSIS — O10912 Unspecified pre-existing hypertension complicating pregnancy, second trimester: Secondary | ICD-10-CM

## 2024-02-16 DIAGNOSIS — Z3A25 25 weeks gestation of pregnancy: Secondary | ICD-10-CM

## 2024-02-16 DIAGNOSIS — Z113 Encounter for screening for infections with a predominantly sexual mode of transmission: Secondary | ICD-10-CM

## 2024-02-16 DIAGNOSIS — O0990 Supervision of high risk pregnancy, unspecified, unspecified trimester: Secondary | ICD-10-CM

## 2024-02-16 NOTE — Telephone Encounter (Signed)
 Reached out to pt to reschedule ROB appt that was scheduled on 02/16/2024 at 1:15 with A. Hildy Lowers.  Was able to reschedule for 57/25 at 11:15 with LMD.

## 2024-02-20 ENCOUNTER — Other Ambulatory Visit: Payer: Self-pay | Admitting: Certified Nurse Midwife

## 2024-02-20 DIAGNOSIS — O161 Unspecified maternal hypertension, first trimester: Secondary | ICD-10-CM

## 2024-02-22 ENCOUNTER — Ambulatory Visit (INDEPENDENT_AMBULATORY_CARE_PROVIDER_SITE_OTHER): Admitting: Licensed Practical Nurse

## 2024-02-22 VITALS — BP 128/75 | HR 85 | Wt 184.6 lb

## 2024-02-22 DIAGNOSIS — Z3A25 25 weeks gestation of pregnancy: Secondary | ICD-10-CM | POA: Diagnosis not present

## 2024-02-22 DIAGNOSIS — O0992 Supervision of high risk pregnancy, unspecified, second trimester: Secondary | ICD-10-CM | POA: Diagnosis not present

## 2024-02-22 DIAGNOSIS — Z3A28 28 weeks gestation of pregnancy: Secondary | ICD-10-CM

## 2024-02-22 DIAGNOSIS — Z131 Encounter for screening for diabetes mellitus: Secondary | ICD-10-CM

## 2024-02-22 DIAGNOSIS — O0990 Supervision of high risk pregnancy, unspecified, unspecified trimester: Secondary | ICD-10-CM

## 2024-02-22 NOTE — Progress Notes (Signed)
    Return Prenatal Note   Subjective   38 y.o. Jordan Mathis at [redacted]w[redacted]d presents for this follow-up prenatal visit.  Patient Doing well. Initial BP elevated but repeat WNL, pt prefers to not be on medication, has been staying active and eating well-is hoping to avoid medication and GDM this pregnancy.  -Breastfed her children for 10-13, months, has had over supply issues.  Patient reports: Movement: Present Contractions: Not present  Objective   Flow sheet Vitals: Pulse Rate: 85 BP: 128/75 Fundal Height: 28 cm Fetal Heart Rate (bpm): 150 Total weight gain: 12 lb 9.6 oz (5.715 kg)  General Appearance  No acute distress, well appearing, and well nourished Pulmonary   Normal work of breathing Neurologic   Alert and oriented to person, place, and time Psychiatric   Mood and affect within normal limits  Assessment/Plan   Plan  38 y.o. [id number] at [redacted]w[redacted]d presents for follow-up OB visit. Reviewed prenatal record including previous visit note.  Supervision of high risk pregnancy, antepartum -Needs to see MD to discuss TOLAC -has children ages 34,9 and 2, her mother and sister will watch them while in labor.  -reviewed US , normal anatomy with AC in 11%, growth US  ordered -TWG 12, which is appropriate -28 wk labs and offer TDAP next visit      Orders Placed This Encounter  Procedures   28 Week RH+Panel    Standing Status:   Future    Expected Date:   03/07/2024    Expiration Date:   02/21/2025   No follow-ups on file.   Future Appointments  Date Time Provider Department Center  03/07/2024 10:55 AM Phylliss Brenner, CNM [facility] None    For next visit:  ROB with 1 hour glucola, third trimester labs, and Tdap     Berkley Breech [facility], CNM  02/22/2511:06 PM

## 2024-02-22 NOTE — Assessment & Plan Note (Addendum)
-  Needs to see MD to discuss TOLAC -has children ages 32,9 and 2, her mother and sister will watch them while in labor.  -reviewed US , normal anatomy with AC in 11%, growth US  ordered -TWG 12, which is appropriate -28 wk labs and offer TDAP next visit

## 2024-03-05 ENCOUNTER — Other Ambulatory Visit

## 2024-03-05 ENCOUNTER — Ambulatory Visit: Payer: Medicaid Other

## 2024-03-07 ENCOUNTER — Ambulatory Visit (INDEPENDENT_AMBULATORY_CARE_PROVIDER_SITE_OTHER): Admitting: Obstetrics

## 2024-03-07 ENCOUNTER — Other Ambulatory Visit

## 2024-03-07 ENCOUNTER — Encounter: Payer: Self-pay | Admitting: Obstetrics

## 2024-03-07 VITALS — BP 114/73 | HR 89 | Wt 186.0 lb

## 2024-03-07 DIAGNOSIS — O34219 Maternal care for unspecified type scar from previous cesarean delivery: Secondary | ICD-10-CM

## 2024-03-07 DIAGNOSIS — Z8759 Personal history of other complications of pregnancy, childbirth and the puerperium: Secondary | ICD-10-CM

## 2024-03-07 DIAGNOSIS — Z8632 Personal history of gestational diabetes: Secondary | ICD-10-CM

## 2024-03-07 DIAGNOSIS — Z3A27 27 weeks gestation of pregnancy: Secondary | ICD-10-CM | POA: Diagnosis not present

## 2024-03-07 DIAGNOSIS — O0990 Supervision of high risk pregnancy, unspecified, unspecified trimester: Secondary | ICD-10-CM

## 2024-03-07 DIAGNOSIS — Z3A28 28 weeks gestation of pregnancy: Secondary | ICD-10-CM

## 2024-03-07 DIAGNOSIS — O09299 Supervision of pregnancy with other poor reproductive or obstetric history, unspecified trimester: Secondary | ICD-10-CM

## 2024-03-07 NOTE — Assessment & Plan Note (Addendum)
-  Has already had successful VBAC x 1 -Will schedule next visit with MD for [facility] consent

## 2024-03-07 NOTE — Assessment & Plan Note (Addendum)
-  Normotensive today. Not taking Procardia . -28-week labs today. Reviewed healthy pregnancy diet -Undecided about contraception, but will likely choose OCPs. Handout given. -Discussed making a birth plan. Desires unmedicated birth. -Growth scan 03/19/24 -Reviewed kick counts and preterm labor warning signs. Instructed to call office or come to hospital with persistent headache, vision changes, regular contractions, leaking of fluid, decreased fetal movement or vaginal bleeding.

## 2024-03-07 NOTE — Progress Notes (Signed)
    Return Prenatal Note   Assessment/Plan   Plan  38 y.o. F6O1308 at [redacted]w[redacted]d presents for follow-up OB visit. Reviewed prenatal record including previous visit note.  Desires VBAC (vaginal birth after cesarean) trial -Has already had successful VBAC x 1 -Will schedule next visit with MD for [facility] consent  Supervision of high risk pregnancy, antepartum -Normotensive today. Not taking Procardia . -28-week labs today. Reviewed healthy pregnancy diet -Undecided about contraception, but will likely choose OCPs. Handout given. -Discussed making a birth plan. Desires unmedicated birth. -Growth scan 03/19/24 -Reviewed kick counts and preterm labor warning signs. Instructed to call office or come to hospital with persistent headache, vision changes, regular contractions, leaking of fluid, decreased fetal movement or vaginal bleeding.     No orders of the defined types were placed in this encounter.  Return in about 2 weeks (around 03/21/2024).   Future Appointments  Date Time Provider Department Center  03/19/2024  8:00 AM [facility]  3 [facility]  [facility]  03/21/2024 10:15 AM Sofia Dunn, MD [facility] None    For next visit:  Routine prenatal care    Subjective   Jordan Mathis is tired but feeling well. Her daughter is graduating 5th grade this week and her son his twice a week ST appts. She is undecided about contraception. Her partner wants more children in the future, but she does not. She has had Depo and Nexplanon in the past, but had too much bleeding. She is considering going back on the pill. She plans an unmedicated birth and will be supported by her partner, sisters, and mom. They will also help her postpartum.  Movement: Present Contractions: Irritability  Objective   Flow sheet Vitals: Pulse Rate: 89 BP: 114/73 Total weight gain: 14 lb (6.35 kg)  General Appearance  No acute distress, well appearing, and well nourished Pulmonary   Normal work of breathing Neurologic   Alert and oriented to  person, place, and time Psychiatric   Mood and affect within normal limits  Josue Nip, CNM 03/07/24 11:15 AM

## 2024-03-08 LAB — 28 WEEK RH+PANEL
Basophils Absolute: 0 10*3/uL (ref 0.0–0.2)
Basos: 0 %
EOS (ABSOLUTE): 0.2 10*3/uL (ref 0.0–0.4)
Eos: 3 %
Gestational Diabetes Screen: 165 mg/dL — ABNORMAL HIGH (ref 70–139)
HIV Screen 4th Generation wRfx: NONREACTIVE
Hematocrit: 33.9 % — ABNORMAL LOW (ref 34.0–46.6)
Hemoglobin: 10.7 g/dL — ABNORMAL LOW (ref 11.1–15.9)
Immature Grans (Abs): 0 10*3/uL (ref 0.0–0.1)
Immature Granulocytes: 1 %
Lymphocytes Absolute: 2 10*3/uL (ref 0.7–3.1)
Lymphs: 26 %
MCH: 27.4 pg (ref 26.6–33.0)
MCHC: 31.6 g/dL (ref 31.5–35.7)
MCV: 87 fL (ref 79–97)
Monocytes Absolute: 0.4 10*3/uL (ref 0.1–0.9)
Monocytes: 6 %
Neutrophils Absolute: 5 10*3/uL (ref 1.4–7.0)
Neutrophils: 64 %
Platelets: 271 10*3/uL (ref 150–450)
RBC: 3.9 x10E6/uL (ref 3.77–5.28)
RDW: 12.9 % (ref 11.7–15.4)
RPR Ser Ql: NONREACTIVE
WBC: 7.6 10*3/uL (ref 3.4–10.8)

## 2024-03-09 ENCOUNTER — Ambulatory Visit: Payer: Self-pay | Admitting: Licensed Practical Nurse

## 2024-03-09 ENCOUNTER — Other Ambulatory Visit: Payer: Self-pay | Admitting: Licensed Practical Nurse

## 2024-03-09 DIAGNOSIS — O99810 Abnormal glucose complicating pregnancy: Secondary | ICD-10-CM

## 2024-03-19 ENCOUNTER — Ambulatory Visit
Admission: RE | Admit: 2024-03-19 | Discharge: 2024-03-19 | Disposition: A | Discharge status: Home / Self Care | Source: Ambulatory Visit | Attending: Certified Nurse Midwife | Admitting: Certified Nurse Midwife

## 2024-03-19 DIAGNOSIS — O161 Unspecified maternal hypertension, first trimester: Secondary | ICD-10-CM | POA: Insufficient documentation

## 2024-03-21 ENCOUNTER — Ambulatory Visit (INDEPENDENT_AMBULATORY_CARE_PROVIDER_SITE_OTHER): Admitting: Obstetrics

## 2024-03-21 VITALS — BP 114/72 | HR 86 | Wt 188.0 lb

## 2024-03-21 DIAGNOSIS — O99810 Abnormal glucose complicating pregnancy: Secondary | ICD-10-CM | POA: Diagnosis not present

## 2024-03-21 DIAGNOSIS — O0993 Supervision of high risk pregnancy, unspecified, third trimester: Secondary | ICD-10-CM | POA: Diagnosis not present

## 2024-03-21 DIAGNOSIS — O34219 Maternal care for unspecified type scar from previous cesarean delivery: Secondary | ICD-10-CM | POA: Diagnosis not present

## 2024-03-21 DIAGNOSIS — O10919 Unspecified pre-existing hypertension complicating pregnancy, unspecified trimester: Secondary | ICD-10-CM

## 2024-03-21 DIAGNOSIS — O10013 Pre-existing essential hypertension complicating pregnancy, third trimester: Secondary | ICD-10-CM | POA: Diagnosis not present

## 2024-03-21 DIAGNOSIS — O0990 Supervision of high risk pregnancy, unspecified, unspecified trimester: Secondary | ICD-10-CM

## 2024-03-21 DIAGNOSIS — Z3A29 29 weeks gestation of pregnancy: Secondary | ICD-10-CM

## 2024-03-21 NOTE — Progress Notes (Signed)
    Return Prenatal Note   Subjective  38 y.o. F6O1308 at [redacted]w[redacted]d presents for this follow-up prenatal visit. Pregnancy notable for prior CD x 1 with subsequent VBAC, cHTN dx in first trimester, no longer on medications, hx of preeclampsia, hx of GDM, AMA, and rubella non-immune.   Patient with more frequent and painful BH ctx, sometimes keeping her awake and will pace around at night.   cHTN: diagnosed around 5wks and started on Procardia , was weaned off. BP without medication has been normotensive. With hx of preeclampsia is taking daily ASA.   Recently had elevated 1hGTT, has been "putting off" her 3hGTT for "as long as she can." Also needs TOLAC counseling today, does desire VBAC this delivery.  Patient reports: Movement: Present Contractions: Irritability Denies vaginal bleeding or leaking fluid. Objective  Flow sheet Vitals: Pulse Rate: 86 BP: 114/72 Fundal Height: 30 cm Fetal Heart Rate (bpm): 151 Total weight gain: 16 lb (7.258 kg)  General Appearance  No acute distress, well appearing, and well nourished Pulmonary   Normal work of breathing Neurologic   Alert and oriented to person, place, and time Psychiatric   Mood and affect within normal limits   Assessment/Plan   Plan  38 y.o. [id number] at [redacted]w[redacted]d by LMP=8wk US  presents for follow-up OB visit. Reviewed prenatal record including previous visit note.  1. Supervision of high risk pregnancy, antepartum (Primary) -Third trimester anticipatory guidance discussed -BH contractions: extremely painful, has Procardia  at home -- can use sparingly; counseled on PTL precautions and not to mask, SE of Procardia , and when she should seek evaluation.   2. Desires VBAC (vaginal birth after cesarean) trial Discussed risks vs benefits of TOLAC vs repeat C-section.  Risk of uterine rupture at term is ~ 1%.  Risk of failed trial of labor after cesarean (TOLAC) without a vaginal birth after cesarean (VBAC) resulting in repeat cesarean  delivery (RCD) in about 20 to 40 percent of women who attempt VBAC.  Risk of requiring emergency C-section due to uterine rupture discussed. Risk of fetal blood loss and/or hypoxia, even brain injury and/or death discussed. The benefits of a trial of labor after cesarean (TOLAC) resulting in a vaginal birth after cesarean (VBAC) include the following: shorter length of hospital stay and postpartum recovery (in most cases); fewer complications, such as postpartum fever, wound or uterine infection, thromboembolism (blood clots in the leg or lung), need for blood transfusion.  Patient notes understanding.  Still desires TOLAC.  Informed consent signed today.   3. Chronic hypertension affecting pregnancy -BP well-controlled, normotensive, and not taking Procardia  -Continue daily ASA  -Deliver by 39.6 if BP remains well-controlled and off meds  4. Impaired glucose in pregnancy, antepartum -Hx of prior GDM, and 1hGTT = 165; counseled on maternal and fetal harms from hyperglycemia and encouraged pt to complete 3hGTT asap. She agrees and will schedule within the next few days.   Return in about 2 weeks (around 04/04/2024) for ROB; next avail for 3hGTT.   Future Appointments  Date Time Provider Department Center  03/22/2024  9:00 AM [facility] None  04/04/2024  9:35 AM Aisha Ali, Barbra Boone, CNM [facility]-A[facility] None   For next visit:  continue with routine prenatal care    Sofia Dunn, DO [facility]

## 2024-03-22 ENCOUNTER — Other Ambulatory Visit

## 2024-03-22 DIAGNOSIS — O99810 Abnormal glucose complicating pregnancy: Secondary | ICD-10-CM

## 2024-03-23 ENCOUNTER — Ambulatory Visit: Payer: Self-pay | Admitting: Licensed Practical Nurse

## 2024-03-23 LAB — GESTATIONAL GLUCOSE TOLERANCE
Glucose, Fasting: 74 mg/dL (ref 70–94)
Glucose, GTT - 1 Hour: 127 mg/dL (ref 70–179)
Glucose, GTT - 2 Hour: 130 mg/dL (ref 70–154)
Glucose, GTT - 3 Hour: 114 mg/dL (ref 70–139)

## 2024-03-27 ENCOUNTER — Telehealth: Payer: Self-pay

## 2024-03-27 ENCOUNTER — Inpatient Hospital Stay (HOSPITAL_COMMUNITY)
Admission: AD | Admit: 2024-03-27 | Discharge: 2024-03-27 | Disposition: A | Discharge status: Home / Self Care | Attending: Obstetrics and Gynecology | Admitting: Obstetrics and Gynecology

## 2024-03-27 ENCOUNTER — Encounter (HOSPITAL_COMMUNITY): Payer: Self-pay | Admitting: Obstetrics and Gynecology

## 2024-03-27 ENCOUNTER — Inpatient Hospital Stay (HOSPITAL_COMMUNITY)

## 2024-03-27 ENCOUNTER — Other Ambulatory Visit (HOSPITAL_COMMUNITY): Admission: RE | Admit: 2024-03-27 | Source: Ambulatory Visit

## 2024-03-27 DIAGNOSIS — O209 Hemorrhage in early pregnancy, unspecified: Secondary | ICD-10-CM | POA: Diagnosis present

## 2024-03-27 DIAGNOSIS — O4693 Antepartum hemorrhage, unspecified, third trimester: Secondary | ICD-10-CM

## 2024-03-27 DIAGNOSIS — O10013 Pre-existing essential hypertension complicating pregnancy, third trimester: Secondary | ICD-10-CM

## 2024-03-27 DIAGNOSIS — O09293 Supervision of pregnancy with other poor reproductive or obstetric history, third trimester: Secondary | ICD-10-CM | POA: Diagnosis not present

## 2024-03-27 DIAGNOSIS — E669 Obesity, unspecified: Secondary | ICD-10-CM | POA: Diagnosis not present

## 2024-03-27 DIAGNOSIS — O09523 Supervision of elderly multigravida, third trimester: Secondary | ICD-10-CM | POA: Insufficient documentation

## 2024-03-27 DIAGNOSIS — O34219 Maternal care for unspecified type scar from previous cesarean delivery: Secondary | ICD-10-CM

## 2024-03-27 DIAGNOSIS — O99213 Obesity complicating pregnancy, third trimester: Secondary | ICD-10-CM

## 2024-03-27 DIAGNOSIS — Z3A30 30 weeks gestation of pregnancy: Secondary | ICD-10-CM | POA: Diagnosis not present

## 2024-03-27 DIAGNOSIS — N930 Postcoital and contact bleeding: Secondary | ICD-10-CM | POA: Insufficient documentation

## 2024-03-27 DIAGNOSIS — Z8632 Personal history of gestational diabetes: Secondary | ICD-10-CM | POA: Diagnosis not present

## 2024-03-27 LAB — URINALYSIS, ROUTINE W REFLEX MICROSCOPIC
Bilirubin Urine: NEGATIVE
Glucose, UA: NEGATIVE mg/dL
Ketones, ur: NEGATIVE mg/dL
Leukocytes,Ua: NEGATIVE
Nitrite: NEGATIVE
Protein, ur: NEGATIVE mg/dL
Specific Gravity, Urine: 1.005 (ref 1.005–1.030)
pH: 7 (ref 5.0–8.0)

## 2024-03-27 LAB — WET PREP, GENITAL
Clue Cells Wet Prep HPF POC: NONE SEEN
Sperm: NONE SEEN
Trich, Wet Prep: NONE SEEN
WBC, Wet Prep HPF POC: <10 (ref <10)
Yeast Wet Prep HPF POC: NONE SEEN

## 2024-03-27 NOTE — Telephone Encounter (Signed)
 Chart reviewed. Patient reported to L&D as advised.

## 2024-03-27 NOTE — MAU Note (Signed)
 Pt says last night at [place]- she woke to go to B-room- saw  red blood in toilet and when she wiped - on paper.  Then went to bed - this am - some brown  blood on sheets. Went to b-room - saw more red blood when she wiped . She called Dr - [facility] - told to come here.  Now in Triage - in pants- a spot  of light red . Feels mild cramps- started last week. Dr appointment last week- told them - has Procardia  at home- told to take it.  Last sex- Monday am

## 2024-03-27 NOTE — Telephone Encounter (Signed)
 Jordan Mathis

## 2024-03-27 NOTE — Discharge Instructions (Signed)
 Reasons to return to MAU at [facility]: Your blood pressure is >160/110. You have a headache that will not go away after having something to eat, something to drink, and Tylenol . You have changes in your vision or upper abdominal pain that does not get better with Tylenol .  You begin to have strong, frequent contractions 5 minutes apart or less, each last 1 minute, these have been going on for 1-2 hours, and you cannot walk or talk during them Your water breaks.  Sometimes it is a big gush of fluid, sometimes it is just a trickle that keeps getting your panties wet or running down your legs You have vaginal bleeding.  It is normal to have a small amount of spotting if your cervix was checked.  You don't feel your baby moving like normal.  If you don't, get you something to eat and drink and lay down and focus on feeling your baby move. If your baby is still not moving like normal, you should call your OB or go to the MAU.

## 2024-03-27 NOTE — MAU Provider Note (Addendum)
 History     CSN: [id number]  Arrival date and time: 03/27/24 0636   Event Date/Time   First Provider Initiated Contact with Patient    Chief Complaint  Patient presents with   Vaginal Bleeding    HPI  Jordan Mathis is a 38 y.o. [id number] at [redacted]w[redacted]d who presents to the MAU for vaginal bleeding. Had intercourse yesterday AM, no symptoms throughout the day. Woke up around midnight and noticed some bright red vaginal bleeding in toilet and with wiping, no clots noted. Subsequently had old blood on sheets and red blood with wiping. Reports occasional Braxton Hicks contractions, but this is typical for her and not new. No LOF. Normal FM. No abnormal vaginal discharge, odor, or discomfort. Denies dysuria, constipation, or diarrhea.  Past Medical History:  Diagnosis Date   Chlamydia 03/2021   GERD (gastroesophageal reflux disease)    History of anemia    History of gestational diabetes mellitus (GDM)    History of pre-eclampsia    History of urinary tract infection     Past Surgical History:  Procedure Laterality Date   CESAREAN SECTION  10/02/2014    Family History  Problem Relation Age of Onset   Hypertension Mother 32   Hypertension Father    Colon cancer Maternal Grandmother     Social History   Tobacco Use   Smoking status: Never   Smokeless tobacco: Never  Vaping Use   Vaping status: Never Used  Substance Use Topics   Alcohol use: Never    Comment: Last ETOH use end of 05/2020.   Drug use: Never    Allergies:  Allergies  Allergen Reactions   Amoxicillin Hives   Penicillins Hives   Penicillins Hives    Medications Prior to Admission  Medication Sig Dispense Refill Last Dose/Taking   aspirin  EC 81 MG tablet Take 1 tablet (81 mg total) by mouth daily. Take after 12 weeks for prevention of preeclampssia later in pregnancy 300 tablet 2 03/26/2024   Prenatal Vit-Fe Fumarate-FA (MULTIVITAMIN-PRENATAL) 27-0.8 MG TABS tablet Take 1 tablet by mouth daily at  12 noon. 30 tablet 11 03/26/2024   NIFEdipine  (PROCARDIA -XL/NIFEDICAL-XL) 30 MG 24 hr tablet Take 1 tablet (30 mg total) by mouth daily. Can increase to twice a day as needed for symptomatic contractions 90 tablet 3 More than a month    ROS reviewed and pertinent positives and negatives as documented in HPI.  Physical Exam   Blood pressure 121/81, pulse 80, temperature 98.1 F (36.7 C), temperature source Oral, resp. rate 16, height 5\' 3"  (1.6 m), weight 85.2 kg, last menstrual period 08/25/2023, SpO2 99%.  Physical Exam Exam conducted with a chaperone present Monta Anton RN).  Constitutional:      General: She is not in acute distress.    Appearance: Normal appearance. She is not ill-appearing.  HENT:     Head: Normocephalic and atraumatic.  Cardiovascular:     Rate and Rhythm: Normal rate.  Pulmonary:     Effort: Pulmonary effort is normal.     Breath sounds: Normal breath sounds.  Abdominal:     Palpations: Abdomen is soft.     Tenderness: There is no abdominal tenderness. There is no guarding.  Genitourinary:    General: Normal vulva.     Exam position: Lithotomy position.     Vagina: Bleeding (scant blood in vaginal canal) present.     Cervix: Normal. No discharge or cervical bleeding.     Comments: Cervical os closed Musculoskeletal:  General: Normal range of motion.  Skin:    General: Skin is warm and dry.     Findings: No rash.  Neurological:     General: No focal deficit present.     Mental Status: She is alert and oriented to person, place, and time.   Fetal Tracing: Baseline: 140 Variability: moderate Accelerations: yes  Decelerations: none Toco: no regular uterine contractions    MAU Course  Procedures  MDM [name] at [redacted]w[redacted]d presenting for vaginal bleeding in third trimester. Had intercourse yesterday, otherwise no inciting symptoms that she can recall, now noticing old blood mainly. No injury or trauma to abd. She is hemodynamically  stable with soft, nontender abd. EFM appropriate for GA. Toco quiet. Rh positive. Last U/S at [redacted]w[redacted]d with posterior placenta. Will begin w/up with re-evaluation of placenta. Discussed speculum exam to be performed after U/S and would obtain swabs at that time.   Patient care handed over to [facility], [place] at [phone].  Melanie Spires, MD OB Fellow, Faculty Practice [facility], Center for [facility] taken over by Maryclare Smoke, PA-C. US  shows posterior placenta, no signs of placental abruption or previa. Speculum exam negative for cervical or vaginal lesions. Wet prep and UA negative for infection.   Assessment and Plan     ICD-10-CM   1. Vaginal bleeding in pregnancy, third trimester  O46.93 Discharge patient    2. Postcoital bleeding  N93.0 Discharge patient    3. [redacted] weeks gestation of pregnancy  Z3A.30 Discharge patient      Discharged from MAU in stable condition with preterm labor precautions. Discussed that spotting likely due to postcoital irritation. Patient verbalized understanding.

## 2024-03-28 LAB — GC/CHLAMYDIA PROBE AMP (~~LOC~~) NOT AT ARMC
Chlamydia: NEGATIVE
Comment: NEGATIVE
Comment: NORMAL
Neisseria Gonorrhea: NEGATIVE

## 2024-04-04 ENCOUNTER — Encounter: Payer: Self-pay | Admitting: Obstetrics

## 2024-04-04 ENCOUNTER — Ambulatory Visit (INDEPENDENT_AMBULATORY_CARE_PROVIDER_SITE_OTHER): Admitting: Obstetrics

## 2024-04-04 VITALS — BP 117/74 | HR 90 | Wt 189.0 lb

## 2024-04-04 DIAGNOSIS — O10013 Pre-existing essential hypertension complicating pregnancy, third trimester: Secondary | ICD-10-CM

## 2024-04-04 DIAGNOSIS — Z8632 Personal history of gestational diabetes: Secondary | ICD-10-CM

## 2024-04-04 DIAGNOSIS — Z3A31 31 weeks gestation of pregnancy: Secondary | ICD-10-CM | POA: Diagnosis not present

## 2024-04-04 DIAGNOSIS — O34219 Maternal care for unspecified type scar from previous cesarean delivery: Secondary | ICD-10-CM | POA: Diagnosis not present

## 2024-04-04 DIAGNOSIS — Z8759 Personal history of other complications of pregnancy, childbirth and the puerperium: Secondary | ICD-10-CM

## 2024-04-04 DIAGNOSIS — O161 Unspecified maternal hypertension, first trimester: Secondary | ICD-10-CM

## 2024-04-04 DIAGNOSIS — O0990 Supervision of high risk pregnancy, unspecified, unspecified trimester: Secondary | ICD-10-CM

## 2024-04-04 DIAGNOSIS — O10919 Unspecified pre-existing hypertension complicating pregnancy, unspecified trimester: Secondary | ICD-10-CM

## 2024-04-04 NOTE — Progress Notes (Signed)
    Return Prenatal Note   Assessment/Plan   Plan  38 y.o. [id number] at [redacted]w[redacted]d presents for follow-up OB visit. Reviewed prenatal record including previous visit note.  Desires VBAC (vaginal birth after cesarean) trial -TOLAC consent completed 03/21/24  Hypertension affecting pregnancy in first trimester -Initial BP elevated, repeat WNL -Denies severe HA, visual changes, epigastric pain -Growth US  ordered for 36 weeks  Supervision of high risk pregnancy, antepartum -3-hour glucose WNL -Reviewed kick counts and preterm labor warning signs. Instructed to call office or come to hospital with persistent headache, vision changes, regular contractions, leaking of fluid, decreased fetal movement or vaginal bleeding.     Orders Placed This Encounter  Procedures   US  OB Follow Up    Standing Status:   Future    Expected Date:   05/04/2024    Expiration Date:   04/04/2025    Reason for Exam (SYMPTOM  OR DIAGNOSIS REQUIRED):   growth    Preferred Imaging Location?:   Internal   Return in about 2 weeks (around 04/18/2024).   Future Appointments  Date Time Provider Department Center  04/18/2024  1:55 PM Phylliss Brenner, CNM [facility]-A[facility] None    For next visit:  Routine prenatal care    Subjective   Jordan Mathis has no questions or concerns today. She had a headache yesterday from the heat, but it has resolved. She was seen in the MAU for vaginal bleeding. This has since resolved.  Movement: Present Contractions: Irritability  Objective   Flow sheet Vitals: Pulse Rate: 90 BP: 117/74 Fundal Height: 32 cm Fetal Heart Rate (bpm): 128 Total weight gain: 17 lb (7.711 kg)  General Appearance  No acute distress, well appearing, and well nourished Pulmonary   Normal work of breathing Neurologic   Alert and oriented to person, place, and time Psychiatric   Mood and affect within normal limits  Josue Nip, CNM 04/04/24 9:51 AM

## 2024-04-04 NOTE — Assessment & Plan Note (Signed)
-  Initial BP elevated, repeat WNL -Denies severe HA, visual changes, epigastric pain -Growth US  ordered for 36 weeks

## 2024-04-04 NOTE — Assessment & Plan Note (Signed)
-  3-hour glucose WNL -Reviewed kick counts and preterm labor warning signs. Instructed to call office or come to hospital with persistent headache, vision changes, regular contractions, leaking of fluid, decreased fetal movement or vaginal bleeding.

## 2024-04-04 NOTE — Assessment & Plan Note (Signed)
-  TOLAC consent completed 03/21/24

## 2024-04-18 ENCOUNTER — Encounter: Admitting: Obstetrics

## 2024-04-18 DIAGNOSIS — O0990 Supervision of high risk pregnancy, unspecified, unspecified trimester: Secondary | ICD-10-CM

## 2024-04-18 DIAGNOSIS — O09299 Supervision of pregnancy with other poor reproductive or obstetric history, unspecified trimester: Secondary | ICD-10-CM

## 2024-04-18 DIAGNOSIS — Z8759 Personal history of other complications of pregnancy, childbirth and the puerperium: Secondary | ICD-10-CM

## 2024-04-18 DIAGNOSIS — O34219 Maternal care for unspecified type scar from previous cesarean delivery: Secondary | ICD-10-CM

## 2024-04-18 DIAGNOSIS — Z8632 Personal history of gestational diabetes: Secondary | ICD-10-CM

## 2024-04-18 DIAGNOSIS — O161 Unspecified maternal hypertension, first trimester: Secondary | ICD-10-CM

## 2024-04-25 ENCOUNTER — Telehealth: Payer: Self-pay | Admitting: Obstetrics

## 2024-04-25 NOTE — Telephone Encounter (Signed)
 Pt was scheduled for ROB with EMERSON Canny on 04/18/2024 at 1:55.  Pt was rescheduled to 05/04/2024 at 9:55 with E. Slaughterbeck.

## 2024-05-02 NOTE — Progress Notes (Unsigned)
    Return Prenatal Note   Subjective   38 y.o. H4E6986 at [redacted]w[redacted]d presents for this follow-up prenatal visit.  Patient has been feeling decreased fetal movement. She also has been having episodes of nausea and diarrhea the past two weeks.  Patient reports:  increased nausea, vomiting and diarrhea this week.  Also noticed baby is not moving around as much as usual since she's been ill.   Movement: (!) Decreased Contractions: Not present  Objective    Flow sheet Vitals: Pulse Rate: 83 BP: 130/83 Total weight gain: 22 lb 4.8 oz (10.1 kg)  General Appearance  No acute distress, well appearing, and well nourished Pulmonary   Normal work of breathing Neurologic   Alert and oriented to person, place, and time Psychiatric   Mood and affect within normal limits  Assessment/Plan   Plan  38 y.o. H4E6986 at 104w6d presents for follow-up OB visit. Reviewed prenatal record including previous visit note.  Hypertension affecting pregnancy in first trimester Normotensive today without symptoms.  Growth US  scheduled for next week.   Supervision of high risk pregnancy, antepartum Reviewed comfort measures for GI illness symptoms.  Reactive NST in office. Baby started moving during monitoring GBS collected today Reviewed kick counts and preterm labor warning signs. Instructed to call office or come to hospital with persistent headache, vision changes, regular contractions, leaking of fluid, decreased fetal movement or vaginal bleeding.       Orders Placed This Encounter  Procedures   Culture, beta strep (group b only)   No follow-ups on file.   Future Appointments  Date Time Provider Department Center  05/07/2024 11:15 AM [facility] US  1 [facility]-IMG None  05/09/2024 10:55 AM Onis Markoff, Damien, CNM [facility]-AOB None   Lenell Dominquie Abrams [birth year] [redacted]w[redacted]d  Fetus A Non-Stress Test Interpretation for 05/04/24  Indication: Decreased Fetal Movement  Fetal Heart Rate A Mode:  External Baseline Rate (A): 145 bpm Variability: Moderate Accelerations: 15 x 15 Decelerations: None Scalp Stimulation: Negative Multiple birth?: No     Interpretation (Fetal Testing) Nonstress Test Interpretation: Reactive Overall Impression: Reassuring for gestational age   For next visit:  continue with routine prenatal care     Damien PARSLEY, CNM  07/18/252:03 PM

## 2024-05-04 ENCOUNTER — Ambulatory Visit: Admitting: Certified Nurse Midwife

## 2024-05-04 ENCOUNTER — Other Ambulatory Visit (HOSPITAL_COMMUNITY)
Admission: RE | Admit: 2024-05-04 | Discharge: 2024-05-04 | Disposition: A | Discharge status: Home / Self Care | Source: Ambulatory Visit | Attending: Certified Nurse Midwife | Admitting: Certified Nurse Midwife

## 2024-05-04 VITALS — BP 130/83 | HR 83 | Wt 194.3 lb

## 2024-05-04 DIAGNOSIS — Z3685 Encounter for antenatal screening for Streptococcus B: Secondary | ICD-10-CM | POA: Diagnosis not present

## 2024-05-04 DIAGNOSIS — Z113 Encounter for screening for infections with a predominantly sexual mode of transmission: Secondary | ICD-10-CM | POA: Insufficient documentation

## 2024-05-04 DIAGNOSIS — Z3A36 36 weeks gestation of pregnancy: Secondary | ICD-10-CM | POA: Diagnosis not present

## 2024-05-04 DIAGNOSIS — O0990 Supervision of high risk pregnancy, unspecified, unspecified trimester: Secondary | ICD-10-CM

## 2024-05-04 DIAGNOSIS — O163 Unspecified maternal hypertension, third trimester: Secondary | ICD-10-CM | POA: Diagnosis not present

## 2024-05-04 DIAGNOSIS — O0993 Supervision of high risk pregnancy, unspecified, third trimester: Secondary | ICD-10-CM | POA: Diagnosis not present

## 2024-05-04 DIAGNOSIS — O161 Unspecified maternal hypertension, first trimester: Secondary | ICD-10-CM

## 2024-05-04 NOTE — Assessment & Plan Note (Addendum)
 Normotensive today without symptoms.  Growth US  scheduled for next week.

## 2024-05-04 NOTE — Assessment & Plan Note (Signed)
 Reviewed comfort measures for GI illness symptoms.  Reactive NST in office. Baby started moving during monitoring GBS collected today Reviewed kick counts and preterm labor warning signs. Instructed to call office or come to hospital with persistent headache, vision changes, regular contractions, leaking of fluid, decreased fetal movement or vaginal bleeding.

## 2024-05-07 ENCOUNTER — Ambulatory Visit

## 2024-05-07 DIAGNOSIS — O10013 Pre-existing essential hypertension complicating pregnancy, third trimester: Secondary | ICD-10-CM | POA: Diagnosis not present

## 2024-05-07 DIAGNOSIS — Z3A35 35 weeks gestation of pregnancy: Secondary | ICD-10-CM | POA: Diagnosis not present

## 2024-05-07 DIAGNOSIS — O10919 Unspecified pre-existing hypertension complicating pregnancy, unspecified trimester: Secondary | ICD-10-CM

## 2024-05-07 LAB — CERVICOVAGINAL ANCILLARY ONLY
Chlamydia: NEGATIVE
Comment: NEGATIVE
Comment: NORMAL
Neisseria Gonorrhea: NEGATIVE

## 2024-05-08 LAB — CULTURE, BETA STREP (GROUP B ONLY): Strep Gp B Culture: NEGATIVE

## 2024-05-09 ENCOUNTER — Encounter: Payer: Self-pay | Admitting: Certified Nurse Midwife

## 2024-05-09 ENCOUNTER — Ambulatory Visit: Admitting: Certified Nurse Midwife

## 2024-05-09 VITALS — BP 121/93 | HR 80 | Wt 196.1 lb

## 2024-05-09 DIAGNOSIS — O0993 Supervision of high risk pregnancy, unspecified, third trimester: Secondary | ICD-10-CM | POA: Diagnosis not present

## 2024-05-09 DIAGNOSIS — Z3A36 36 weeks gestation of pregnancy: Secondary | ICD-10-CM

## 2024-05-09 DIAGNOSIS — O0990 Supervision of high risk pregnancy, unspecified, unspecified trimester: Secondary | ICD-10-CM

## 2024-05-09 NOTE — Assessment & Plan Note (Addendum)
 Feeling ready for baby. Feeling better than last visit. Nausea and diarrhea have improved.  Reviewed last US . Growth 51%. Reviewed labor warning signs and expectations for birth. Instructed to call office or come to hospital with persistent headache, vision changes, regular contractions, leaking of fluid, decreased fetal movement or vaginal bleeding.

## 2024-05-09 NOTE — Progress Notes (Signed)
    Return Prenatal Note   Subjective   38 y.o. H4E6986 at [redacted]w[redacted]d presents for this follow-up prenatal visit.  Patient is doing well. She denies having urinary symptoms. She has no new concerns today. Patient reports: Movement: Present Contractions: Regular  Objective   Flow sheet Vitals: Pulse Rate: 80 BP: (!) 121/93 Fundal Height: 37 cm Fetal Heart Rate (bpm): 140 Total weight gain: 24 lb 1.6 oz (10.9 kg)  General Appearance  No acute distress, well appearing, and well nourished Pulmonary   Normal work of breathing Neurologic   Alert and oriented to person, place, and time Psychiatric   Mood and affect within normal limits  Assessment/Plan   Plan  38 y.o. H4E6986 at [redacted]w[redacted]d presents for follow-up OB visit. Reviewed prenatal record including previous visit note.  Supervision of high risk pregnancy, antepartum Feeling ready for baby. Feeling better than last visit. Nausea and diarrhea have improved.  Reviewed last US . Growth 51%. Reviewed labor warning signs and expectations for birth. Instructed to call office or come to hospital with persistent headache, vision changes, regular contractions, leaking of fluid, decreased fetal movement or vaginal bleeding.       No orders of the defined types were placed in this encounter.  No follow-ups on file.   Future Appointments  Date Time Provider Department Center  05/17/2024  2:55 PM Justino Eleanor HERO, CNM [facility]-AOB None     For next visit:  continue with routine prenatal care     Damien Parsley, CNM [facility] 07/23/251:41 PM

## 2024-05-13 ENCOUNTER — Ambulatory Visit: Payer: Self-pay | Admitting: Obstetrics

## 2024-05-17 ENCOUNTER — Encounter: Payer: Self-pay | Admitting: Obstetrics and Gynecology

## 2024-05-17 ENCOUNTER — Ambulatory Visit: Admitting: Obstetrics

## 2024-05-17 ENCOUNTER — Inpatient Hospital Stay
Admission: EM | Admit: 2024-05-17 | Discharge: 2024-05-20 | DRG: 807 | Disposition: A | Discharge status: Home / Self Care | Attending: Certified Nurse Midwife | Admitting: Certified Nurse Midwife

## 2024-05-17 ENCOUNTER — Encounter: Payer: Self-pay | Admitting: Obstetrics

## 2024-05-17 ENCOUNTER — Other Ambulatory Visit: Payer: Self-pay

## 2024-05-17 VITALS — BP 136/84 | HR 90 | Wt 194.0 lb

## 2024-05-17 DIAGNOSIS — O34219 Maternal care for unspecified type scar from previous cesarean delivery: Secondary | ICD-10-CM | POA: Diagnosis present

## 2024-05-17 DIAGNOSIS — O0993 Supervision of high risk pregnancy, unspecified, third trimester: Secondary | ICD-10-CM | POA: Diagnosis not present

## 2024-05-17 DIAGNOSIS — O163 Unspecified maternal hypertension, third trimester: Secondary | ICD-10-CM

## 2024-05-17 DIAGNOSIS — O114 Pre-existing hypertension with pre-eclampsia, complicating childbirth: Principal | ICD-10-CM | POA: Diagnosis present

## 2024-05-17 DIAGNOSIS — O1092 Unspecified pre-existing hypertension complicating childbirth: Secondary | ICD-10-CM | POA: Diagnosis present

## 2024-05-17 DIAGNOSIS — R030 Elevated blood-pressure reading, without diagnosis of hypertension: Secondary | ICD-10-CM | POA: Diagnosis present

## 2024-05-17 DIAGNOSIS — K219 Gastro-esophageal reflux disease without esophagitis: Secondary | ICD-10-CM | POA: Diagnosis present

## 2024-05-17 DIAGNOSIS — Z8249 Family history of ischemic heart disease and other diseases of the circulatory system: Secondary | ICD-10-CM

## 2024-05-17 DIAGNOSIS — O99214 Obesity complicating childbirth: Secondary | ICD-10-CM | POA: Diagnosis present

## 2024-05-17 DIAGNOSIS — O9962 Diseases of the digestive system complicating childbirth: Secondary | ICD-10-CM | POA: Diagnosis present

## 2024-05-17 DIAGNOSIS — Z3A38 38 weeks gestation of pregnancy: Secondary | ICD-10-CM

## 2024-05-17 DIAGNOSIS — O10919 Unspecified pre-existing hypertension complicating pregnancy, unspecified trimester: Secondary | ICD-10-CM | POA: Diagnosis present

## 2024-05-17 DIAGNOSIS — E669 Obesity, unspecified: Secondary | ICD-10-CM | POA: Diagnosis not present

## 2024-05-17 DIAGNOSIS — O0990 Supervision of high risk pregnancy, unspecified, unspecified trimester: Secondary | ICD-10-CM

## 2024-05-17 HISTORY — DX: Essential (primary) hypertension: I10

## 2024-05-17 LAB — CBC WITH DIFFERENTIAL/PLATELET
Abs Immature Granulocytes: 0.05 K/uL (ref 0.00–0.07)
Basophils Absolute: 0 K/uL (ref 0.0–0.1)
Basophils Relative: 0 %
Eosinophils Absolute: 0.1 K/uL (ref 0.0–0.5)
Eosinophils Relative: 1 %
HCT: 32.8 % — ABNORMAL LOW (ref 36.0–46.0)
Hemoglobin: 10.8 g/dL — ABNORMAL LOW (ref 12.0–15.0)
Immature Granulocytes: 1 %
Lymphocytes Relative: 33 %
Lymphs Abs: 2.5 K/uL (ref 0.7–4.0)
MCH: 27.5 pg (ref 26.0–34.0)
MCHC: 32.9 g/dL (ref 30.0–36.0)
MCV: 83.5 fL (ref 80.0–100.0)
Monocytes Absolute: 0.7 K/uL (ref 0.1–1.0)
Monocytes Relative: 9 %
Neutro Abs: 4.4 K/uL (ref 1.7–7.7)
Neutrophils Relative %: 56 %
Platelets: 283 K/uL (ref 150–400)
RBC: 3.93 MIL/uL (ref 3.87–5.11)
RDW: 13.8 % (ref 11.5–15.5)
WBC: 7.7 K/uL (ref 4.0–10.5)
nRBC: 0 % (ref 0.0–0.2)

## 2024-05-17 LAB — PROTEIN / CREATININE RATIO, URINE
Creatinine, Urine: 16 mg/dL
Total Protein, Urine: <6 mg/dL

## 2024-05-17 LAB — COMPREHENSIVE METABOLIC PANEL WITH GFR
ALT: 14 U/L (ref 0–44)
AST: 16 U/L (ref 15–41)
Albumin: 2.9 g/dL — ABNORMAL LOW (ref 3.5–5.0)
Alkaline Phosphatase: 122 U/L (ref 38–126)
Anion gap: 10 (ref 5–15)
BUN: <5 mg/dL — ABNORMAL LOW (ref 6–20)
CO2: 20 mmol/L — ABNORMAL LOW (ref 22–32)
Calcium: 9 mg/dL (ref 8.9–10.3)
Chloride: 108 mmol/L (ref 98–111)
Creatinine, Ser: 0.41 mg/dL — ABNORMAL LOW (ref 0.44–1.00)
GFR, Estimated: >60 mL/min (ref >60)
Glucose, Bld: 77 mg/dL (ref 70–99)
Potassium: 3.6 mmol/L (ref 3.5–5.1)
Sodium: 138 mmol/L (ref 135–145)
Total Bilirubin: 0.6 mg/dL (ref 0.0–1.2)
Total Protein: 7.1 g/dL (ref 6.5–8.1)

## 2024-05-17 LAB — TYPE AND SCREEN
ABO/RH(D): B POS
Antibody Screen: NEGATIVE

## 2024-05-17 MED ORDER — OXYTOCIN-SODIUM CHLORIDE 30-0.9 UT/500ML-% IV SOLN
1.0000 m[IU]/min | INTRAVENOUS | Status: DC
  Administered 2024-05-17: 2 m[IU]/min via INTRAVENOUS

## 2024-05-17 MED ORDER — LACTATED RINGERS IV SOLN: 500.0000 mL | INTRAVENOUS | Status: DC | PRN

## 2024-05-17 MED ORDER — LACTATED RINGERS IV SOLN: INTRAVENOUS | Status: DC

## 2024-05-17 MED ORDER — TERBUTALINE SULFATE 1 MG/ML IJ SOLN: 0.2500 mg | Freq: Once | INTRAMUSCULAR | Status: DC | PRN

## 2024-05-17 MED ORDER — DIPHENHYDRAMINE HCL 25 MG PO CAPS: 50.0000 mg | ORAL_CAPSULE | Freq: Four times a day (QID) | ORAL | Status: DC | PRN

## 2024-05-17 MED ORDER — LIDOCAINE HCL (PF) 1 % IJ SOLN: 30.0000 mL | INTRAMUSCULAR | Status: DC | PRN

## 2024-05-17 MED ORDER — MAGNESIUM SULFATE 40 GM/1000ML IV SOLN
2.0000 g/h | INTRAVENOUS | Status: DC
  Administered 2024-05-17 – 2024-05-18 (×2): 2 g/h via INTRAVENOUS
  Filled 2024-05-17 (×2): qty 1000

## 2024-05-17 MED ORDER — LABETALOL HCL 5 MG/ML IV SOLN: 20.0000 mg | INTRAVENOUS | Status: DC | PRN

## 2024-05-17 MED ORDER — OXYTOCIN-SODIUM CHLORIDE 30-0.9 UT/500ML-% IV SOLN
2.5000 [IU]/h | INTRAVENOUS | Status: DC
  Filled 2024-05-17: qty 500

## 2024-05-17 MED ORDER — OXYTOCIN BOLUS FROM INFUSION
333.0000 mL | Freq: Once | INTRAVENOUS | Status: AC
  Administered 2024-05-18: 333 mL via INTRAVENOUS

## 2024-05-17 MED ORDER — MAGNESIUM OXIDE -MG SUPPLEMENT 400 (240 MG) MG PO TABS
400.0000 mg | ORAL_TABLET | Freq: Once | ORAL | Status: DC
  Filled 2024-05-17: qty 1

## 2024-05-17 MED ORDER — ONDANSETRON HCL 4 MG/2ML IJ SOLN
4.0000 mg | Freq: Four times a day (QID) | INTRAMUSCULAR | Status: DC | PRN
Start: 2024-05-17 — End: 2024-05-18

## 2024-05-17 MED ORDER — ACETAMINOPHEN 500 MG PO TABS
1000.0000 mg | ORAL_TABLET | Freq: Four times a day (QID) | ORAL | Status: DC | PRN
  Administered 2024-05-18: 1000 mg via ORAL
  Filled 2024-05-17: qty 2

## 2024-05-17 MED ORDER — BUTALBITAL-APAP-CAFFEINE 50-325-40 MG PO TABS
2.0000 | ORAL_TABLET | Freq: Once | ORAL | Status: AC
  Administered 2024-05-17: 2 via ORAL
  Filled 2024-05-17: qty 2

## 2024-05-17 MED ORDER — LABETALOL HCL 5 MG/ML IV SOLN: 80.0000 mg | INTRAVENOUS | Status: DC | PRN

## 2024-05-17 MED ORDER — MAGNESIUM SULFATE BOLUS VIA INFUSION
4.0000 g | Freq: Once | INTRAVENOUS | Status: AC
  Administered 2024-05-17: 4 g via INTRAVENOUS
  Filled 2024-05-17: qty 1000

## 2024-05-17 MED ORDER — HYDRALAZINE HCL 20 MG/ML IJ SOLN: 10.0000 mg | INTRAMUSCULAR | Status: DC | PRN

## 2024-05-17 MED ORDER — NIFEDIPINE ER OSMOTIC RELEASE 30 MG PO TB24
30.0000 mg | ORAL_TABLET | Freq: Every day | ORAL | Status: DC
  Administered 2024-05-18 – 2024-05-20 (×3): 30 mg via ORAL
  Filled 2024-05-17 (×4): qty 1

## 2024-05-17 MED ORDER — CALCIUM GLUCONATE 10 % IV SOLN
INTRAVENOUS | Status: AC
  Filled 2024-05-17: qty 10

## 2024-05-17 MED ORDER — MAGNESIUM SULFATE 2 GM/50ML IV SOLN: 2.0000 g | Freq: Once | INTRAVENOUS | Status: DC

## 2024-05-17 MED ORDER — LABETALOL HCL 5 MG/ML IV SOLN: 40.0000 mg | INTRAVENOUS | Status: DC | PRN

## 2024-05-17 MED ORDER — FENTANYL CITRATE (PF) 100 MCG/2ML IJ SOLN
50.0000 ug | INTRAMUSCULAR | Status: DC | PRN
  Administered 2024-05-18: 100 ug via INTRAVENOUS
  Filled 2024-05-17: qty 2

## 2024-05-17 MED ORDER — LACTATED RINGERS IV SOLN: INTRAVENOUS | Status: AC

## 2024-05-17 MED ORDER — METOCLOPRAMIDE HCL 10 MG PO TABS
10.0000 mg | ORAL_TABLET | Freq: Once | ORAL | Status: AC
  Administered 2024-05-17: 10 mg via ORAL
  Filled 2024-05-17 (×2): qty 1

## 2024-05-17 MED ORDER — SOD CITRATE-CITRIC ACID 500-334 MG/5ML PO SOLN
30.0000 mL | ORAL | Status: DC | PRN
Start: 2024-05-17 — End: 2024-05-18

## 2024-05-17 NOTE — Progress Notes (Signed)
    Return Prenatal Note   Assessment/Plan   Plan  38 y.o. H4E6986 at [redacted]w[redacted]d presents for follow-up OB visit. Reviewed prenatal record including previous visit note.  Supervision of high risk pregnancy, antepartum -To L&D for preeclamspia workup and possible induction. Discussed concern for preeclampsia and recommendation for further evaluation   No orders of the defined types were placed in this encounter.  No follow-ups on file.   No future appointments.     Subjective   Delhyia reports N/V and HA for the past two days. Her HA has been unrelieved by multiple doses of Tylenol . She checked her BP at home, and it was elevated to the 140s/100s. She decided to restart her Procardia , which brought her BP down to 130s/80s. The HA still persists. She denies RUQ pain and visual changes but is experiencing photosensitivity.  Movement: Present Contractions: Irritability  Objective   Flow sheet Vitals: Pulse Rate: 90 BP: 136/84 Fetal Heart Rate (bpm): 155 Total weight gain: 22 lb (9.979 kg)  General Appearance  No acute distress, well appearing, and well nourished Pulmonary   Normal work of breathing Neurologic   Alert and oriented to person, place, and time Psychiatric   Mood and affect within normal limits  Eleanor Canny, CNM 05/17/24 5:00 PM

## 2024-05-17 NOTE — Progress Notes (Signed)
 Jordan Mathis is a 38 y.o. H4E6986 at [redacted]w[redacted]d by LMP admitted for induction of labor due to preeclampsia with severe features .  Subjective: Reports HA 7/10, starting to feeling more uncomfortable contractions.   Objective: BP (!) 140/94   Pulse 88   Temp 98.1 F (36.7 C) (Oral)   Resp 18   Ht 5' 3 (1.6 m)   Wt 88 kg   LMP 08/25/2023   SpO2 100%   BMI 34.37 kg/m  I/O last 3 completed shifts: In: 740 [P.O.:740] Out: -  Total I/O In: -  Out: 550 [Other:550]  FHT:  FHR: 140 bpm, variability: moderate,  accelerations:  Present,  decelerations:  Absent UC:   regular, every 2-4 minutes SVE:   Dilation: 4.5 Effacement (%): 50 Station: -1 Exam by:: L Henrik Orihuela, CNM Pitocin  at 8 milli-units   Labs: Lab Results  Component Value Date   WBC 7.7 05/17/2024   HGB 10.8 (L) 05/17/2024   HCT 32.8 (L) 05/17/2024   MCV 83.5 05/17/2024   PLT 283 05/17/2024    Assessment / Plan: IOL for preeclampsia with severe features progressing   Labor: Progressing normally titrate Pitocin   Preeclampsia:  BP mild range  Fetal Wellbeing:  Category I Pain Control:  aware of all options, will ask when desired  I/D:  GSB negative, Membranes intact  Anticipated MOD:  TOLAC  Dr Suzanna updated on pt's status   JINNIE HERO [facility], CNM 05/17/2024, 9:25 PM

## 2024-05-17 NOTE — H&P (Signed)
 Jordan Mathis is a 38 y.o. female presenting for evaluation for high blood pressure. Jordan Mathis was diagnosed with CHTN in early pregnancy, she was started on Procardia  but needed to discontinue it due to hypotension. Today was her ROB she reported a headache x 1 day that did not improve with Tylenol  and home blood pressures of 140's/100's.  She was sent to [facility] for evaluation.   While in triage Jordan Mathis was given 2 Fioricet  with no relief. She reports the her headache is in the front along her forehead and just hurts denies visual disturbances but is sensitive to light. She denies history of headaches/Migraines. Over the last 2 weeks she has Mathis episodes of nausea requiring Zofran . She endorses + FM, has felt mild contractions. Denies LOF/VB.   Given the persistent HA, elevated BP and hx CHTN it was recommended to be admitted for labor induction and Magnesium  infusion. Pt in agreement.   Jordan Mathis early and regular prenatal care.  Her pregnancy has been complicated by obesity, hx c/s followed by [facility] and CHTN. An US  on 7/21 showed an EFW 2946 grams  Growth is 51.2 percentile. MVP is 6.04 cm.   OB History     Gravida  5   Para  3   Term  3   Preterm  0   AB  1   Living  3      SAB  0   IAB  1   Ectopic  0   Multiple  0   Live Births  3          Past Medical History:  Diagnosis Date   Chlamydia 03/2021   GERD (gastroesophageal reflux disease)    History of anemia    History of gestational diabetes mellitus (GDM)    History of pre-eclampsia    History of urinary tract infection    Past Surgical History:  Procedure Laterality Date   CESAREAN SECTION  10/02/2014   Family History: family history includes Colon cancer in her maternal grandmother; Hypertension in her father; Hypertension (age of onset: 82) in her mother. Social History:  reports that she has never smoked. She has never used smokeless tobacco. She reports that she does not drink alcohol and  does not use drugs.     Maternal Diabetes: No Genetic Screening: Normal Maternal Ultrasounds/Referrals: Normal Fetal Ultrasounds or other Referrals:  None Maternal Substance Abuse:  No Significant Maternal Medications:  None Significant Maternal Lab Results:  Group B Strep negative Number of Prenatal Visits:greater than 3 verified prenatal visits Maternal Vaccinations:declined TDAP  Other Comments:  None  Review of Systems  Eyes:  Positive for photophobia. Negative for visual disturbance.  Respiratory:  Negative for shortness of breath.   Cardiovascular: Negative.   Gastrointestinal:        Braxton hicks contractions   Endocrine: Negative.   Genitourinary: Negative.   Musculoskeletal: Negative.   Skin: Negative.   Allergic/Immunologic: Negative.   Neurological:  Positive for headaches.  Hematological: Negative.   Psychiatric/Behavioral: Negative.     History Dilation: 1 Effacement (%): 50 Station: -1 Exam by:: Jordan Mathis, CNM Blood pressure 139/86, pulse 84, temperature 98.1 F (36.7 C), temperature source Oral, resp. rate 18, height 5' 3 (1.6 Jordan), weight 88 kg, last menstrual period 08/25/2023. Exam Physical Exam Constitutional:      Appearance: Normal appearance.  Cardiovascular:     Rate and Rhythm: Normal rate and regular rhythm.  Pulmonary:     Effort: Pulmonary effort is  normal.     Breath sounds: Normal breath sounds.  Abdominal:     Tenderness: There is no abdominal tenderness.     Comments: Gravid, EFW 7lbs   Musculoskeletal:     Cervical back: Normal range of motion and neck supple.     Comments: Trace BLE   Skin:    General: Skin is warm.  Neurological:     General: No focal deficit present.     Mental Status: She is alert.  Psychiatric:        Mood and Affect: Mood normal.        Thought Content: Thought content normal.   EFM: baseline 160, moderate variability, pos accel, neg decel  TOCO: Irregular contractions   Dilation: 1 Effacement (%):  50 Cervical Position: Middle Station: -1 Presentation: Vertex Exam by:: Jordan Mathis, CNM   Prenatal labs: ABO, Rh: B/Positive/-- (02/04 1556) Antibody: Negative (02/04 1556) Rubella: <0.90 (02/04 1556) RPR: Non Reactive (05/21 1123)  HBsAg: Negative (02/04 1556)  HIV: Non Reactive (05/21 1123)  GBS: Negative/-- (07/18 1123)   Assessment/Plan: H4E6986 at 38wks admitted for labor induction secondary to preeclampsia with severe features   -TOLAC  Continuous monitoring   Will place ripening balloon and start low dose pitocin   -Magnesium  ordered  -GBS negative, membranes intact  -Pain management: aware of all options, will ask if desired  Dr Suzanna called, reviewed assessment and plan   Jordan Mathis, CNM  [facility] 05/17/2024 5:55 PM    Jordan Mathis Jordan Mathis 05/17/2024, 5:42 PM

## 2024-05-17 NOTE — Progress Notes (Signed)
 Jordan Mathis is a 38 y.o. H4E6986 at [redacted]w[redacted]d by LMP admitted for induction of labor due to preeclampsia with severe features  .  Subjective: Continues to have headache.   Objective: BP 139/86   Pulse 84   Temp 98.1 F (36.7 C) (Oral)   Resp 18   Ht 5' 3 (1.6 m)   Wt 88 kg   LMP 08/25/2023   BMI 34.37 kg/m  No intake/output data recorded. No intake/output data recorded.  FHT:  FHR: 155 bpm, variability: moderate,  accelerations:  Present,  decelerations:  Absent UC:   irregular  SVE:   Dilation: 1 Effacement (%): 50 Station: -1 Exam by:: L Christifer Chapdelaine, CNM  Labs: Lab Results  Component Value Date   WBC 7.7 05/17/2024   HGB 10.8 (L) 05/17/2024   HCT 32.8 (L) 05/17/2024   MCV 83.5 05/17/2024   PLT 283 05/17/2024    Assessment / Plan: IOL secondary to Preeclampsia with severe features , ripening stage   Labor: cook's catheter easily inserted with 80/69ml of NS, will titrate Pitocin  to 10milli-units keep there until cook's out.  Preeclampsia:  Magnesium  infusing, Reglan  ordered for HA.  Fetal Wellbeing:  Category I Pain Control:  aware of all options, will ask if desired.  I/D:  GSB negative, membranes intact  Anticipated MOD:  VBAC  Jordan Mathis Jordan Mathis, CNM 05/17/2024, 6:50 PM

## 2024-05-17 NOTE — Assessment & Plan Note (Addendum)
-  To L&D for preeclamspia workup and possible induction. Discussed concern for preeclampsia and recommendation for further evaluation

## 2024-05-17 NOTE — OB Triage Note (Signed)
 Patient is a 38 yo, G5P3, at 38 weeks 0 days. Patient presents with complaints of elevated blood pressure in clinic of 130's/90's and headache. States headache started yesterday, painful enough that she did not sleep much last night, and took 1g Tylenol  at noon today, which did not help headache pain. Denies RUQ pain, visual disturbances, presents with no edema, DTR's +1 and no clonus in BLE.  Patient denies any vaginal bleeding or LOF. Patient reports FM. Monitors applied and assessing. VSS. Initial fetal heart tone 155. Dominic CNM notified of patients arrival to unit. Plan to obtain labs, treat headache, and obtain serial blood pressures

## 2024-05-18 ENCOUNTER — Inpatient Hospital Stay: Admitting: Anesthesiology

## 2024-05-18 ENCOUNTER — Encounter: Payer: Self-pay | Admitting: Obstetrics and Gynecology

## 2024-05-18 DIAGNOSIS — E669 Obesity, unspecified: Secondary | ICD-10-CM

## 2024-05-18 DIAGNOSIS — O34219 Maternal care for unspecified type scar from previous cesarean delivery: Secondary | ICD-10-CM

## 2024-05-18 DIAGNOSIS — O99214 Obesity complicating childbirth: Secondary | ICD-10-CM

## 2024-05-18 DIAGNOSIS — Z3A38 38 weeks gestation of pregnancy: Secondary | ICD-10-CM

## 2024-05-18 DIAGNOSIS — O114 Pre-existing hypertension with pre-eclampsia, complicating childbirth: Principal | ICD-10-CM

## 2024-05-18 MED ORDER — PHENYLEPHRINE 80 MCG/ML (10ML) SYRINGE FOR IV PUSH (FOR BLOOD PRESSURE SUPPORT): 80.0000 ug | PREFILLED_SYRINGE | INTRAVENOUS | Status: DC | PRN

## 2024-05-18 MED ORDER — EPHEDRINE 5 MG/ML INJ: 10.0000 mg | INTRAVENOUS | Status: DC | PRN

## 2024-05-18 MED ORDER — IBUPROFEN 600 MG PO TABS
600.0000 mg | ORAL_TABLET | Freq: Four times a day (QID) | ORAL | Status: DC
  Administered 2024-05-18 – 2024-05-20 (×7): 600 mg via ORAL
  Filled 2024-05-18 (×8): qty 1

## 2024-05-18 MED ORDER — SIMETHICONE 80 MG PO CHEW: 80.0000 mg | CHEWABLE_TABLET | ORAL | Status: DC | PRN

## 2024-05-18 MED ORDER — LIDOCAINE-EPINEPHRINE (PF) 1.5 %-1:200000 IJ SOLN
INTRAMUSCULAR | Status: DC | PRN
  Administered 2024-05-18: 3 mL via EPIDURAL

## 2024-05-18 MED ORDER — ONDANSETRON HCL 4 MG/2ML IJ SOLN: 4.0000 mg | INTRAMUSCULAR | Status: DC | PRN

## 2024-05-18 MED ORDER — ONDANSETRON HCL 4 MG PO TABS: 4.0000 mg | ORAL_TABLET | ORAL | Status: DC | PRN

## 2024-05-18 MED ORDER — LIDOCAINE HCL (PF) 1 % IJ SOLN
INTRAMUSCULAR | Status: DC | PRN
  Administered 2024-05-18: 3 mL via SUBCUTANEOUS

## 2024-05-18 MED ORDER — ZOLPIDEM TARTRATE 5 MG PO TABS: 5.0000 mg | ORAL_TABLET | Freq: Every evening | ORAL | Status: DC | PRN

## 2024-05-18 MED ORDER — PRENATAL MULTIVITAMIN CH
1.0000 | ORAL_TABLET | Freq: Every day | ORAL | Status: DC
  Administered 2024-05-19: 1 via ORAL
  Filled 2024-05-18 (×2): qty 1

## 2024-05-18 MED ORDER — COCONUT OIL OIL: 1.0000 | TOPICAL_OIL | Status: DC | PRN

## 2024-05-18 MED ORDER — BENZOCAINE-MENTHOL 20-0.5 % EX AERO
1.0000 | INHALATION_SPRAY | CUTANEOUS | Status: DC | PRN
Start: 2024-05-18 — End: 2024-05-20

## 2024-05-18 MED ORDER — DOCUSATE SODIUM 100 MG PO CAPS
100.0000 mg | ORAL_CAPSULE | Freq: Two times a day (BID) | ORAL | Status: DC
  Administered 2024-05-18 – 2024-05-20 (×4): 100 mg via ORAL
  Filled 2024-05-18 (×4): qty 1

## 2024-05-18 MED ORDER — DIPHENHYDRAMINE HCL 50 MG/ML IJ SOLN: 12.5000 mg | INTRAMUSCULAR | Status: DC | PRN

## 2024-05-18 MED ORDER — LACTATED RINGERS IV SOLN
500.0000 mL | Freq: Once | INTRAVENOUS | Status: AC
  Administered 2024-05-18: 500 mL via INTRAVENOUS

## 2024-05-18 MED ORDER — BUPIVACAINE HCL (PF) 0.25 % IJ SOLN
INTRAMUSCULAR | Status: DC | PRN
  Administered 2024-05-18 (×2): 4 mL via EPIDURAL

## 2024-05-18 MED ORDER — ACETAMINOPHEN 500 MG PO TABS
1000.0000 mg | ORAL_TABLET | Freq: Four times a day (QID) | ORAL | Status: DC
  Administered 2024-05-18 – 2024-05-20 (×7): 1000 mg via ORAL
  Filled 2024-05-18 (×8): qty 2

## 2024-05-18 MED ORDER — FENTANYL-BUPIVACAINE-NACL 0.5-0.125-0.9 MG/250ML-% EP SOLN
12.0000 mL/h | EPIDURAL | Status: DC | PRN
  Administered 2024-05-18: 12 mL/h via EPIDURAL
  Filled 2024-05-18: qty 250

## 2024-05-18 MED ORDER — WITCH HAZEL-GLYCERIN EX PADS: 1.0000 | MEDICATED_PAD | CUTANEOUS | Status: DC | PRN

## 2024-05-18 MED ORDER — DIBUCAINE (PERIANAL) 1 % EX OINT: 1.0000 | TOPICAL_OINTMENT | CUTANEOUS | Status: DC | PRN

## 2024-05-18 MED ORDER — LACTATED RINGERS IV BOLUS
1000.0000 mL | Freq: Once | INTRAVENOUS | Status: AC
  Administered 2024-05-18: 1000 mL via INTRAVENOUS

## 2024-05-18 NOTE — Progress Notes (Signed)
 Patient ID: Jordan Mathis, female   DOB: May 11, 1986, 38 y.o.   MRN: [id number] TOLAC from 1730 yesterday . Preeclampsia with severe features given persistent h/a . Delivery [id number] . Placenta delivery with out diffculty . Apgars 8/9 . Good hemostasis.  Continue MAgnesium  until diueresis.

## 2024-05-18 NOTE — Progress Notes (Signed)
 Jordan Mathis is a 38 y.o. H4E6986 at [redacted]w[redacted]d by LMP admitted for induction of labor due to preeclampsia with severe features .  RN called for assessment, pt has bloody discharge that ran down her leg when she got up to the bathroom   Subjective: Feeling tired, recently given Tylenol  for headache, denies visual disturbances or RUQ pain. Reports contraction pain, denies any lower abdominal tenderness.   Objective: BP (!) 152/93   Pulse 90   Temp 98 F (36.7 C) (Oral)   Resp 18   Ht 5' 3 (1.6 m)   Wt 88 kg   LMP 08/25/2023   SpO2 99%   BMI 34.37 kg/m  I/O last 3 completed shifts: In: 740 [P.O.:740] Out: -  Total I/O In: 25.5 [I.V.:25.5] Out: 2050 [Other:2050]  FHT:  FHR: 140 bpm, variability: moderate,  accelerations:  Present,  decelerations:  Absent UC:   regular, every 2-3 minutes SVE:   Dilation: 6 Effacement (%): 60 Station: -1 Exam by:: A. Rowland,RN Pitocin  at 14 milli-units   Labs: Lab Results  Component Value Date   WBC 7.7 05/17/2024   HGB 10.8 (L) 05/17/2024   HCT 32.8 (L) 05/17/2024   MCV 83.5 05/17/2024   PLT 283 05/17/2024    Assessment / Plan: IOL secondary to preeclampsia with severe features.   Labor: Progressing normally, continue to titrate Pitocin , AROm when appropriate. Bloody discharge noted on glove after exam, pt not actively bleeding. Tracing reassuring, will monitor bleeding.  Preeclampsia:  on magnesium  sulfate Fetal Wellbeing:  Category I Pain Control:  will ask if desired  I/D:  gbs negative, membranes intact  Anticipated MOD:  VBAC   Jordan Mathis, CNM 05/18/2024, 1:57 AM

## 2024-05-18 NOTE — Anesthesia Preprocedure Evaluation (Signed)
 Anesthesia Evaluation  Patient identified by MRN, date of birth, ID band Patient awake    Reviewed: Allergy & Precautions, NPO status , Patient's Chart, lab work & pertinent test results  History of Anesthesia Complications Negative for: history of anesthetic complications  Airway Mallampati: III  TM Distance: >3 FB Neck ROM: full    Dental  (+) Chipped   Pulmonary neg pulmonary ROS   Pulmonary exam normal        Cardiovascular Exercise Tolerance: Good hypertension, negative cardio ROS Normal cardiovascular exam     Neuro/Psych    GI/Hepatic ,GERD  ,,  Endo/Other  diabetes    Renal/GU   negative genitourinary   Musculoskeletal   Abdominal   Peds  Hematology negative hematology ROS (+)   Anesthesia Other Findings Past Medical History: 03/2021: Chlamydia No date: GERD (gastroesophageal reflux disease) No date: History of anemia No date: History of gestational diabetes mellitus (GDM) No date: History of pre-eclampsia No date: History of urinary tract infection No date: Hypertension  Past Surgical History: 10/02/2014: CESAREAN SECTION  BMI    Body Mass Index: 34.37 kg/m      Reproductive/Obstetrics (+) Pregnancy                              Anesthesia Physical Anesthesia Plan  ASA: 3  Anesthesia Plan: Epidural   Post-op Pain Management:    Induction:   PONV Risk Score and Plan:   Airway Management Planned: Natural Airway  Additional Equipment:   Intra-op Plan:   Post-operative Plan:   Informed Consent: I have reviewed the patients History and Physical, chart, labs and discussed the procedure including the risks, benefits and alternatives for the proposed anesthesia with the patient or authorized representative who has indicated his/her understanding and acceptance.     Dental Advisory Given  Plan Discussed with: Anesthesiologist  Anesthesia Plan Comments:  (Patient reports no bleeding problems and no anticoagulant use.   Patient consented for risks of anesthesia including but not limited to:  - adverse reactions to medications - risk of bleeding, infection and or nerve damage from epidural that could lead to paralysis - risk of headache or failed epidural - nerve damage due to positioning - that if epidural is used for C-section that there is a chance of epidural failure requiring spinal placement or conversion to GA - Damage to heart, brain, lungs, other parts of body or loss of life  Patient voiced understanding and assent.)        Anesthesia Quick Evaluation

## 2024-05-18 NOTE — Lactation Note (Signed)
 This note was copied from a baby's chart. Lactation Consultation Note  Patient Name: Jordan Mathis Unijb'd Date: 05/18/2024 Age:36 hours   Lactation to room for an initial assessment, lights dim mom sleeping.  Gave report to RN.       Keaden Gunnoe S Janaisa Birkland 05/18/2024, 5:18 PM

## 2024-05-18 NOTE — Progress Notes (Signed)
 Fetal monitoring removed from [phone] during epidural placement.

## 2024-05-18 NOTE — Discharge Summary (Signed)
 Postpartum Discharge Summary   Patient Name: Jordan Mathis DOB: 05-04-1986 MRN: [id number]  Date of admission: 05/17/2024 Delivery date:05/18/2024 Delivering provider: DELINDA MELNICK MARIE Date of discharge: 05/20/2024  Admitting diagnosis: Indication for care in labor and delivery, antepartum [O75.9] Chronic hypertension affecting pregnancy [O10.919] Intrauterine pregnancy: [redacted]w[redacted]d     Secondary diagnosis:  Principal Problem:   Chronic hypertension affecting pregnancy  Additional problems: preeclampsia with severe features     Discharge diagnosis: Term Pregnancy Delivered, VBAC, Preeclampsia (severe), and CHTN                                              Post partum procedures:none Augmentation: AROM, Pitocin , and IP Foley Complications: None  Hospital course: Induction of Labor With Vaginal Delivery   38 y.o. yo H4E5985 at [redacted]w[redacted]d was admitted to the hospital 05/17/2024 for induction of labor.  Indication for induction: preeclampsia with severe features .  Patient had an labor course complicated by NA Membrane Rupture Time/Date: 6:48 AM,05/18/2024  Delivery Method:Vaginal, Spontaneous Operative Delivery:N/A Episiotomy: N/A Lacerations:  intact  Details of delivery can be found in separate delivery note.  Patient had a postpartum course complicated by none. Patient is discharged home 05/20/24.  Newborn Data: Birth date:05/18/2024 Birth time:6:52 AM Gender:Female Living status:Living Apgars:8 ,9  Weight:2810 g  Magnesium  Sulfate received: Yes: Seizure prophylaxis BMZ received: No Rhophylac:N/A MMR: ordered T-DaP:declined  Flu: No RSV Vaccine received: No Transfusion:No Immunizations administered: Immunization History  Administered Date(s) Administered   HPV 9-valent 09/05/2023   Moderna Sars-Covid-2 Vaccination 12/31/2019, 01/21/2020, 02/02/2020, 02/18/2020   PPD Test 03/01/2023    Physical exam  Vitals:   05/19/24 1947 05/19/24 2341 05/20/24 0400 05/20/24  0755  BP: 138/84 (!) 136/99 134/88 123/81  Pulse: 79 74 86 68  Resp: 18 18 18 19   Temp:  98.6 F (37 C)  98.7 F (37.1 C)  TempSrc:  Oral  Oral  SpO2: 97% 97% 100% 99%  Weight:      Height:       General: alert, cooperative, and no distress Lochia: appropriate Uterine Fundus: firm Incision: N/A DVT Evaluation: No evidence of DVT seen on physical exam. Labs: Lab Results  Component Value Date   WBC 9.7 05/20/2024   HGB 10.1 (L) 05/20/2024   HCT 31.9 (L) 05/20/2024   MCV 84.8 05/20/2024   PLT 298 05/20/2024      Latest Ref Rng & Units 05/17/2024    3:53 PM  CMP  Glucose 70 - 99 mg/dL 77   BUN 6 - 20 mg/dL <5   Creatinine 9.55 - 1.00 mg/dL 9.58   Sodium 864 - 854 mmol/L 138   Potassium 3.5 - 5.1 mmol/L 3.6   Chloride 98 - 111 mmol/L 108   CO2 22 - 32 mmol/L 20   Calcium  8.9 - 10.3 mg/dL 9.0   Total Protein 6.5 - 8.1 g/dL 7.1   Total Bilirubin 0.0 - 1.2 mg/dL 0.6   Alkaline Phos 38 - 126 U/L 122   AST 15 - 41 U/L 16   ALT 0 - 44 U/L 14    Edinburgh Score:    05/19/2024    5:13 PM  Edinburgh Postnatal Depression Scale Screening Tool  I have been able to laugh and see the funny side of things. 0  I have looked forward with enjoyment to things. 0  I  have blamed myself unnecessarily when things went wrong. 0  I have been anxious or worried for no good reason. 0  I have felt scared or panicky for no good reason. 0  Things have been getting on top of me. 0  I have been so unhappy that I have had difficulty sleeping. 0  I have felt sad or miserable. 0  I have been so unhappy that I have been crying. 0  The thought of harming myself has occurred to me. 0  Edinburgh Postnatal Depression Scale Total 0      After visit meds:  Allergies as of 05/20/2024       Reactions   Amoxicillin Hives   Penicillins Hives   Penicillins Hives        Medication List     STOP taking these medications    aspirin  EC 81 MG tablet       TAKE these medications     acetaminophen  500 MG tablet Commonly known as: TYLENOL  Take 2 tablets (1,000 mg total) by mouth every 6 (six) hours.   benzocaine -Menthol  20-0.5 % Aero Commonly known as: DERMOPLAST Apply 1 Application topically as needed for irritation (perineal discomfort).   coconut oil Oil Apply 1 Application topically as needed.   dibucaine 1 % Oint Commonly known as: NUPERCAINAL Place 1 Application rectally as needed for hemorrhoids.   docusate sodium  100 MG capsule Commonly known as: COLACE Take 1 capsule (100 mg total) by mouth 2 (two) times daily.   ibuprofen  600 MG tablet Commonly known as: ADVIL  Take 1 tablet (600 mg total) by mouth every 6 (six) hours.   multivitamin-prenatal 27-0.8 MG Tabs tablet Take 1 tablet by mouth daily at 12 noon.   NIFEdipine  30 MG 24 hr tablet Commonly known as: PROCARDIA -XL/NIFEDICAL-XL Take 1 tablet (30 mg total) by mouth daily. Can increase to twice a day as needed for symptomatic contractions   witch hazel-glycerin  pad Commonly known as: TUCKS Apply 1 Application topically as needed for hemorrhoids.         Discharge home in stable condition Infant Feeding: Breast Infant Disposition:home with mother Discharge instruction: per After Visit Summary and Postpartum booklet. Activity: Advance as tolerated. Pelvic rest for 6 weeks.  Diet: routine diet Anticipated Birth Control: POPs Postpartum Appointment:6 weeks Additional Postpartum F/U: BP check 1 week Future Appointments:No future appointments. Follow up Visit:  Follow-up Information     Dominic, Jinnie Jansky, CNM Follow up in 1 week(s).   Specialty: Obstetrics and Gynecology Why: BP check, 2wk mood check, 6 wk PP visit Contact information: [address] [place] [address] [phone]                     05/20/2024 Jordan Mathis, CNM

## 2024-05-18 NOTE — Anesthesia Procedure Notes (Signed)
 Epidural Patient location during procedure: OB Start time: 05/18/2024 6:04 AM End time: 05/18/2024 6:08 AM  Staffing Anesthesiologist: Rydell Wiegel, Fairy POUR, MD Performed: anesthesiologist   Preanesthetic Checklist Completed: patient identified, IV checked, site marked, risks and benefits discussed, surgical consent, monitors and equipment checked, pre-op evaluation and timeout performed  Epidural Patient position: sitting Prep: ChloraPrep Patient monitoring: heart rate, continuous pulse ox and blood pressure Approach: midline Location: L3-L4 Injection technique: LOR saline  Needle:  Needle type: Tuohy  Needle gauge: 17 G Needle length: 9 cm and 9 Needle insertion depth: 7 cm Catheter type: closed end flexible Catheter size: 19 Gauge Catheter at skin depth: 12 cm Test dose: negative and 1.5% lidocaine  with Epi 1:200 K  Assessment Sensory level: T10 Events: blood not aspirated, no cerebrospinal fluid, injection not painful, no injection resistance, no paresthesia and negative IV test  Additional Notes 1 attempt Pt. Evaluated and documentation done after procedure finished. Patient identified. Risks/Benefits/Options discussed with patient including but not limited to bleeding, infection, nerve damage, paralysis, failed block, incomplete pain control, headache, blood pressure changes, nausea, vomiting, reactions to medication both or allergic, itching and postpartum back pain. Confirmed with bedside nurse the patient's most recent platelet count. Confirmed with patient that they are not currently taking any anticoagulation, have any bleeding history or any family history of bleeding disorders. Patient expressed understanding and wished to proceed. All questions were answered. Sterile technique was used throughout the entire procedure. Please see nursing notes for vital signs. Test dose was given through epidural catheter and negative prior to continuing to dose epidural or start  infusion. Warning signs of high block given to the patient including shortness of breath, tingling/numbness in hands, complete motor block, or any concerning symptoms with instructions to call for help. Patient was given instructions on fall risk and not to get out of bed. All questions and concerns addressed with instructions to call with any issues or inadequate analgesia.    Patient tolerated the insertion well without immediate complications.Reason for block:procedure for pain

## 2024-05-18 NOTE — Progress Notes (Signed)
 Progress Note - Vaginal Delivery  Jordan Mathis is a 39 y.o. H4E5985 now PP day 0 s/p Vaginal, Spontaneous.  On postpartum magnesium  sulfate therapy. She denies headache, epigastric pain, visual changes.   Subjective:  The patient reports no complaints, up ad lib, voiding, and tolerating PO   Objective:  Vital signs in last 24 hours: Temp:  [97.8 F (36.6 C)-98.6 F (37 C)] 97.8 F (36.6 C) (08/01 1930) Pulse Rate:  [66-109] 83 (08/01 2030) Resp:  [16-18] 16 (08/01 2030) BP: (117-152)/(70-96) 136/70 (08/01 2030) SpO2:  [90 %-100 %] 100 % (08/01 1930)  Physical Exam:  General: alert, cooperative, appears stated age, and no distress Lungs Clear bilaterally Lochia: appropriate Uterine Fundus: firm Reflexes +1 Negative clonus   Data Review Recent Labs    05/17/24 1553  HGB 10.8*  HCT 32.8*    Assessment/Plan: Principal Problem:   Chronic hypertension affecting pregnancy   Continue magnesium  sulfate therapy first 24 hours postpartum   -- Continue routine PP care.     Zelda Hummer, CNM 05/18/2024 9:08 PM

## 2024-05-18 NOTE — Discharge Instructions (Signed)

## 2024-05-19 LAB — RPR: RPR Ser Ql: NONREACTIVE

## 2024-05-19 MED ORDER — DIPHENHYDRAMINE HCL 50 MG/ML IJ SOLN: 50.0000 mg | Freq: Once | INTRAMUSCULAR | Status: DC

## 2024-05-19 MED ORDER — PROCHLORPERAZINE EDISYLATE 10 MG/2ML IJ SOLN
10.0000 mg | Freq: Once | INTRAMUSCULAR | Status: DC
  Filled 2024-05-19: qty 2

## 2024-05-19 NOTE — Progress Notes (Signed)
 Magnesium  Check Note  05/19/2024 - 10:03 AM  38 y.o. H4E5985.  Pregnancy complicated by preeclampsia with severe features. Magnesium  discontinued at 24 hours postpartum- 6:55 AM this morning. Procardia  due at 10 AM.  Patient Active Problem List   Diagnosis Date Noted   Chronic hypertension affecting pregnancy 05/17/2024   Impaired glucose in pregnancy, antepartum 03/21/2024   Hypertension affecting pregnancy in first trimester 10/16/2021   History of macrosomia in infant in prior pregnancy, currently pregnant 07/08/2021   Desires VBAC (vaginal birth after cesarean) trial 07/08/2021   History of gestational diabetes 07/08/2021   History of pre-eclampsia 07/08/2021   Supervision of high risk pregnancy, antepartum 03/23/2021      Subjective:  Patient admits to mild headache- last dose of pain med at 3 am, denies visual disturbances, denies RUQ pain, denies chest pain, denies shortness of breath. She is tolerating regular diet, delivery pain is controlled with PO medications, ambulating and voiding without difficulty.   Objective:   Vitals:   05/19/24 0645 05/19/24 0650 05/19/24 0655 05/19/24 0755  BP: 128/88   (!) 140/90  Pulse: 96   84  Resp:      Temp:      TempSrc:      SpO2: 100% 100% 99%   Weight:      Height:        Current Vital Signs 24h Vital Sign Ranges  T 99 F (37.2 C) Temp  Avg: 98.4 F (36.9 C)  Min: 97.8 F (36.6 C)  Max: 99 F (37.2 C)  BP (!) 140/90 BP  Min: 116/89  Max: 155/97  HR 84 Pulse  Avg: 90.2  Min: 78  Max: 104  RR 18 Resp  Avg: 16.6  Min: 16  Max: 18  SaO2 99 % Room Air SpO2  Avg: 99.1 %  Min: 90 %  Max: 100 %       24 Hour I/O Current Shift I/O  Time Ins Outs 08/01 0701 - 08/02 0700 In: 5208.5 [P.O.:3405; I.V.:1392.8] Out: 7045 [Urine:7045] No intake/output data recorded.   Physical Exam: General: NAD Pulm: CTAB CV: RRR Abd: Soft, nontender, nondistended, +BS Ext: no s/s DVT Neuro: DTRs 2+ brachioradialis  Labs:  Recent Labs  Lab  05/17/24 1553  WBC 7.7  HGB 10.8*  HCT 32.8*  PLT 283   Recent Labs  Lab 05/17/24 1553  NA 138  K 3.6  CL 108  CO2 20*  BUN <5*  CREATININE 0.41*  CALCIUM  9.0  PROT 7.1  BILITOT 0.6  ALKPHOS 122  ALT 14  AST 16  GLUCOSE 77    Medications SCHEDULED MEDICATIONS   acetaminophen   1,000 mg Oral Q6H   docusate sodium   100 mg Oral BID   ibuprofen   600 mg Oral Q6H   NIFEdipine   30 mg Oral Daily   prenatal multivitamin  1 tablet Oral Q1200    MEDICATION INFUSIONS   magnesium  sulfate Stopped (05/19/24 0655)   oxytocin  Stopped (05/18/24 0951)    PRN MEDICATIONS  benzocaine -Menthol , coconut oil, witch hazel-glycerin  **AND** dibucaine, diphenhydrAMINE , labetalol  **AND** labetalol  **AND** labetalol  **AND** hydrALAZINE  **AND** Measure blood pressure, ondansetron  **OR** ondansetron  (ZOFRAN ) IV, simethicone , zolpidem    Assessment & Plan:  38 y.o. H4E5985 without signs of magnesium  toxicity  Transfer to postpartum Continue routine postpartum care HTN- Procardia  30 mg XL   Slater Rains, CNM [facility] 05/19/2024 10:10 AM

## 2024-05-19 NOTE — Anesthesia Postprocedure Evaluation (Signed)
 Anesthesia Post Note  Patient: Jordan Mathis  Procedure(s) Performed: AN AD HOC LABOR EPIDURAL  Patient location during evaluation: Mother Baby Anesthesia Type: Epidural Level of consciousness: awake and alert Pain management: pain level controlled Vital Signs Assessment: post-procedure vital signs reviewed and stable Respiratory status: spontaneous breathing, nonlabored ventilation and respiratory function stable Cardiovascular status: stable Postop Assessment: no headache, no backache and epidural receding Anesthetic complications: no   No notable events documented.   Last Vitals:  Vitals:   05/19/24 0655 05/19/24 0755  BP:  (!) 140/90  Pulse:  84  Resp:    Temp:    SpO2: 99%     Last Pain:  Vitals:   05/19/24 0821  TempSrc:   PainSc: 0-No pain                 Camellia Merilee Louder

## 2024-05-19 NOTE — Lactation Note (Signed)
 This note was copied from a baby's chart. Lactation Consultation Note  Patient Name: Girl Bobbie Virden Unijb'd Date: 05/19/2024 Age:36 hours Reason for consult: Initial assessment;Early term 37-38.6wks   Maternal Data Does the patient have breastfeeding experience prior to this delivery?: Yes How long did the patient breastfeed?: 10-13 mths  Feeding Mother's Current Feeding Choice: Breast Milk and Formula  LATCH Score                    Lactation Tools Discussed/Used Tools: Pump Breast pump type: Double-Electric Breast Pump Pump Education: Setup, frequency, and cleaning;Milk Storage Reason for Pumping: desires to pump and bottlefeed Pumping frequency: instructed to pump breasts 8x/24 hr or q3 h  Interventions Interventions: DEBP;Education  Discharge Pump: Hands Free;DEBP;Personal  Consult Status Consult Status: PRN    Aldona JONETTA Converse 05/19/2024, 1:54 PM

## 2024-05-20 ENCOUNTER — Other Ambulatory Visit: Payer: Self-pay | Admitting: Certified Nurse Midwife

## 2024-05-20 LAB — CBC
HCT: 31.9 % — ABNORMAL LOW (ref 36.0–46.0)
Hemoglobin: 10.1 g/dL — ABNORMAL LOW (ref 12.0–15.0)
MCH: 26.9 pg (ref 26.0–34.0)
MCHC: 31.7 g/dL (ref 30.0–36.0)
MCV: 84.8 fL (ref 80.0–100.0)
Platelets: 298 K/uL (ref 150–400)
RBC: 3.76 MIL/uL — ABNORMAL LOW (ref 3.87–5.11)
RDW: 14.3 % (ref 11.5–15.5)
WBC: 9.7 K/uL (ref 4.0–10.5)
nRBC: 0 % (ref 0.0–0.2)

## 2024-05-20 MED ORDER — MEASLES, MUMPS & RUBELLA VAC IJ SOLR
0.5000 mL | Freq: Once | INTRAMUSCULAR | Status: DC
  Filled 2024-05-20: qty 0.5

## 2024-05-20 MED ORDER — DIBUCAINE (PERIANAL) 1 % EX OINT: 1.0000 | TOPICAL_OINTMENT | CUTANEOUS | Status: AC | PRN

## 2024-05-20 MED ORDER — DOCUSATE SODIUM 100 MG PO CAPS: 100.0000 mg | ORAL_CAPSULE | Freq: Two times a day (BID) | ORAL | 0 refills | Status: AC

## 2024-05-20 MED ORDER — ACETAMINOPHEN 500 MG PO TABS: 1000.0000 mg | ORAL_TABLET | Freq: Four times a day (QID) | ORAL | 0 refills | Status: AC

## 2024-05-20 MED ORDER — WITCH HAZEL-GLYCERIN EX PADS: 1.0000 | MEDICATED_PAD | CUTANEOUS | 12 refills | Status: AC | PRN

## 2024-05-20 MED ORDER — IBUPROFEN 600 MG PO TABS: 600.0000 mg | ORAL_TABLET | Freq: Four times a day (QID) | ORAL | 0 refills | Status: AC

## 2024-05-20 MED ORDER — BENZOCAINE-MENTHOL 20-0.5 % EX AERO: 1.0000 | INHALATION_SPRAY | CUTANEOUS | 1 refills | Status: AC | PRN

## 2024-05-20 MED ORDER — COCONUT OIL OIL: 1.0000 | TOPICAL_OIL | Status: AC | PRN

## 2024-05-20 MED ORDER — NORETHINDRONE 0.35 MG PO TABS: 1.0000 | ORAL_TABLET | Freq: Every day | ORAL | 4 refills | Status: AC

## 2024-05-20 NOTE — Progress Notes (Signed)
 Patient discharged home with family.  Discharge instructions, when to follow up, and medications reviewed with patient.  Patient verbalized understanding. Patient will be escorted out by staff.

## 2024-05-21 ENCOUNTER — Telehealth: Payer: Self-pay | Admitting: Licensed Practical Nurse

## 2024-05-21 NOTE — Telephone Encounter (Signed)
 Reached out to pt to schedule bp check for Wed. 8/6, 2 week mood check, and 6 week pp visit.  Call went straight to voicemail.  Left message for pt to call back to schedule.

## 2024-05-23 ENCOUNTER — Ambulatory Visit

## 2024-05-23 ENCOUNTER — Telehealth: Payer: Self-pay | Admitting: Licensed Practical Nurse

## 2024-05-23 NOTE — Progress Notes (Deleted)
    NURSE VISIT NOTE  Subjective:    Patient ID: Jordan Mathis, female    DOB: 03-26-1986, 38 y.o.   MRN: [id number]  HPI  Patient is a 38 y.o. [id number] female who presents for BP check per order from Jinnie Cookey, [name].   Patient reports compliance with prescribed BP medications: {YES/NO/WILD CARDS:18581} {BP MEDS:28629}{Doses QD/BID:28638} Last dose of BP medication: {Elopement time:3044023}  BP Readings from Last 3 Encounters:  05/20/24 123/81  05/17/24 136/84  05/09/24 (!) 121/93   Pulse Readings from Last 3 Encounters:  05/20/24 68  05/17/24 90  05/09/24 80    Objective:    There were no vitals taken for this visit.  Assessment:   No diagnosis found.   Plan:   Per Dr. GWENETH Providers:28529}:  {AEEOJW:71368:e}  Patient verbalized understanding of instructions.   Harlene Gander, CMA

## 2024-05-23 NOTE — Telephone Encounter (Signed)
 Reached out to pt to reschedule nurse visit for bp check that was scheduled on 05/23/2024 at 1:15.  Left message for pt to call back.

## 2024-05-25 ENCOUNTER — Encounter: Payer: Self-pay | Admitting: Licensed Practical Nurse

## 2024-05-25 NOTE — Telephone Encounter (Signed)
 Reached out to pt (2x) to reschedule nurse visit for bp check that was scheduled on 05/23/2024 at 1:15.  Left message for pt to call back.  Will send a MyChart letter to pt.

## 2024-07-02 ENCOUNTER — Ambulatory Visit: Admitting: Licensed Practical Nurse

## 2024-07-02 NOTE — Progress Notes (Deleted)
 Postpartum Visit  Chief Complaint: No chief complaint on file.   History of Present Illness: Patient is a 38 y.o. [id number] presents for postpartum visit.  Review the Delivery Report for details.  This patient has no babies on file. <<This visit is not associated with a pregnancy. Delivery information will not be displayed.>><<This visit is not associated with a pregnancy. Delivery information will not be displayed.>> Date of delivery: .ob Type of delivery: Vaginal delivery - Vacuum or forceps assisted  {yes/no:63} Episiotomy No.  Laceration: {yes/no:63}  Pregnancy or labor problems:  {yes/no:63} Any problems since the delivery:  {yes/no:63}  Newborn Details:  SINGLETON :  1. BabyGender {Gender Description:210950033}. Birth weight:   Maternal Details:  Breast or formula feeding: {Plan; breastfeeding:14556} Intercourse: {YES/NO:21197} Contraception after delivery: {YES/NO:21197} Any bowel or bladder issues: {YES/NO:21197} Post partum depression/anxiety noted:  {yes/no:63} Edinburgh Post-Partum Depression Score:*** Date of last PAP: ***  {Findings; lab pap smear results:16707::NIL and HR HPV+,NIL and HR HPV negative}   Review of Systems: ROS  {Common ambulatory SmartLinks:19316}  Past Medical History:  Past Medical History:  Diagnosis Date   Chlamydia 03/2021   GERD (gastroesophageal reflux disease)    History of anemia    History of gestational diabetes mellitus (GDM)    History of pre-eclampsia    History of urinary tract infection    Hypertension     Past Surgical History:  Past Surgical History:  Procedure Laterality Date   CESAREAN SECTION  10/02/2014    Family History:  Family History  Problem Relation Age of Onset   Hypertension Mother 75   Hypertension Father    Colon cancer Maternal Grandmother     Social History:  Social History   Socioeconomic History   Marital status: Significant Other    Spouse name: Chemical engineer   Number of  children: 3   Years of education: 52 (currently enrolled at [employer] in Nursing Courses)   Highest education level: High school graduate  Occupational History   Occupation: Nurse    Comment: Arts administrator  Tobacco Use   Smoking status: Never   Smokeless tobacco: Never  Vaping Use   Vaping status: Never Used  Substance and Sexual Activity   Alcohol use: Never    Comment: Last ETOH use end of 05/2020.   Drug use: Never   Sexual activity: Yes    Birth control/protection: Pill  Other Topics Concern   Not on file  Social History Narrative   ** Merged History Encounter **       Social Drivers of Health   Financial Resource Strain: Low Risk  (10/31/2023)   Overall Financial Resource Strain (CARDIA)    Difficulty of Paying Living Expenses: Not very hard  Food Insecurity: No Food Insecurity (05/17/2024)   Hunger Vital Sign    Worried About Running Out of Food in the Last Year: Never true    Ran Out of Food in the Last Year: Never true  Transportation Needs: No Transportation Needs (05/17/2024)   PRAPARE - Administrator, Civil Service (Medical): No    Lack of Transportation (Non-Medical): No  Physical Activity: Insufficiently Active (10/31/2023)   Exercise Vital Sign    Days of Exercise per Week: 2 days    Minutes of Exercise per Session: 40 min  Stress: No Stress Concern Present (10/31/2023)   [employer] of Occupational Health - Occupational Stress Questionnaire    Feeling of Stress : Not at all  Social Connections: Moderately  Integrated (10/31/2023)   Social Connection and Isolation Panel    Frequency of Communication with Friends and Family: More than three times a week    Frequency of Social Gatherings with Friends and Family: Twice a week    Attends Religious Services: 1 to 4 times per year    Active Member of [employer] or Organizations: No    Attends Banker Meetings: Never    Marital Status: Living with partner  Intimate Partner Violence: Not At Risk  (05/17/2024)   Humiliation, Afraid, Rape, and Kick questionnaire    Fear of Current or Ex-Partner: No    Emotionally Abused: No    Physically Abused: No    Sexually Abused: No    Allergies:  Allergies  Allergen Reactions   Amoxicillin Hives   Penicillins Hives   Penicillins Hives    Medications: Prior to Admission medications   Medication Sig Start Date End Date Taking? Authorizing Provider  norethindrone  (MICRONOR ) 0.35 MG tablet Take 1 tablet (0.35 mg total) by mouth daily. Begin at 4 weeks postpartum 05/20/24   Jayne Harlene CROME, CNM  acetaminophen  (TYLENOL ) 500 MG tablet Take 2 tablets (1,000 mg total) by mouth every 6 (six) hours. 05/20/24   Jayne Harlene CROME, CNM  benzocaine -Menthol  (DERMOPLAST) 20-0.5 % AERO Apply 1 Application topically as needed for irritation (perineal discomfort). 05/20/24   Jayne Harlene CROME, CNM  coconut oil OIL Apply 1 Application topically as needed. 05/20/24   Jayne Harlene CROME, CNM  dibucaine (NUPERCAINAL) 1 % OINT Place 1 Application rectally as needed for hemorrhoids. 05/20/24   Jayne Harlene CROME, CNM  docusate sodium  (COLACE) 100 MG capsule Take 1 capsule (100 mg total) by mouth 2 (two) times daily. 05/20/24   Jayne Harlene CROME, CNM  ibuprofen  (ADVIL ) 600 MG tablet Take 1 tablet (600 mg total) by mouth every 6 (six) hours. 05/20/24   Jayne Harlene CROME, CNM  NIFEdipine  (PROCARDIA -XL/NIFEDICAL-XL) 30 MG 24 hr tablet Take 1 tablet (30 mg total) by mouth daily. Can increase to twice a day as needed for symptomatic contractions 10/04/23   Connell Davies, MD  Prenatal Vit-Fe Fumarate-FA (MULTIVITAMIN-PRENATAL) 27-0.8 MG TABS tablet Take 1 tablet by mouth daily at 12 noon. 08/24/21   Connell Davies, MD  witch hazel-glycerin  (TUCKS) pad Apply 1 Application topically as needed for hemorrhoids. 05/20/24   Jayne Harlene CROME, CNM    Physical Exam unknown if currently breastfeeding.    General: NAD HEENT: normocephalic, anicteric Pulmonary: No increased work  of breathing Abdomen: NABS, soft, non-tender, non-distended.  Umbilicus without lesions.  No hepatomegaly, splenomegaly or masses palpable. No evidence of hernia. Genitourinary:  External: Normal external female genitalia.  Normal urethral meatus, normal  Bartholin's and Skene's glands.    Vagina: Normal vaginal mucosa, no evidence of prolapse.    Cervix: Grossly normal in appearance, no bleeding  Uterus: Non-enlarged, mobile, normal contour.  No CMT  Adnexa: ovaries non-enlarged, no adnexal masses  Rectal: deferred Extremities: no edema, erythema, or tenderness Neurologic: Grossly intact Psychiatric: mood appropriate, affect full    Assessment: 38 y.o. [id number] presenting for 6 week postpartum visit  Plan: Problem List Items Addressed This Visit   None    1) Contraception - Education given regarding options for contraception, as well as compatibility with breast feeding if applicable.  Patient plans on {method:5051} for contraception.  2)  Pap - ASCCP guidelines and rational discussed.  ASCCP guidelines and rational discussed.  Patient opts for {Blank single:19197::***,every 5 years,every 3 years,yearly,discontinue age >65,discontinue secondary  to prior hysterectomy} screening interval  3) Patient underwent screening for postpartum depression with {CHL AMB PED [facility] INDICATIONS:21338}  4) No follow-ups on file.   Jinnie Cookey, CNM  [facility], [facility] 07/02/2024, 1:40 PM

## 2024-08-13 ENCOUNTER — Ambulatory Visit
